# Patient Record
Sex: Female | Born: 1955 | Race: White | Hispanic: No | Smoking: Never smoker
Health system: Southern US, Community
[De-identification: ages and names within clinical notes are randomized; demographics above are authoritative.]

## PROBLEM LIST (undated history)

## (undated) DIAGNOSIS — E669 Obesity, unspecified: Secondary | ICD-10-CM

## (undated) DIAGNOSIS — I10 Essential (primary) hypertension: Secondary | ICD-10-CM

## (undated) DIAGNOSIS — E039 Hypothyroidism, unspecified: Secondary | ICD-10-CM

## (undated) DIAGNOSIS — R112 Nausea with vomiting, unspecified: Secondary | ICD-10-CM

## (undated) DIAGNOSIS — Z9889 Other specified postprocedural states: Secondary | ICD-10-CM

## (undated) DIAGNOSIS — G4730 Sleep apnea, unspecified: Secondary | ICD-10-CM

## (undated) DIAGNOSIS — L570 Actinic keratosis: Secondary | ICD-10-CM

## (undated) DIAGNOSIS — M1990 Unspecified osteoarthritis, unspecified site: Secondary | ICD-10-CM

## (undated) DIAGNOSIS — R0600 Dyspnea, unspecified: Secondary | ICD-10-CM

## (undated) DIAGNOSIS — Q600 Renal agenesis, unilateral: Secondary | ICD-10-CM

## (undated) HISTORY — PX: ABDOMINAL HYSTERECTOMY: SHX81

## (undated) HISTORY — DX: Actinic keratosis: L57.0

## (undated) HISTORY — PX: COLONOSCOPY W/ BIOPSIES AND POLYPECTOMY: SHX1376

---

## 2000-11-14 ENCOUNTER — Other Ambulatory Visit: Admission: RE | Admit: 2000-11-14 | Discharge: 2000-11-14 | Payer: Self-pay | Admitting: Family Medicine

## 2007-09-03 ENCOUNTER — Ambulatory Visit: Payer: Self-pay | Admitting: Gastroenterology

## 2011-04-17 ENCOUNTER — Ambulatory Visit: Payer: Self-pay | Admitting: Unknown Physician Specialty

## 2011-04-19 LAB — PATHOLOGY REPORT

## 2013-03-13 ENCOUNTER — Ambulatory Visit: Payer: Self-pay | Admitting: Family Medicine

## 2013-03-27 ENCOUNTER — Ambulatory Visit: Payer: Self-pay | Admitting: Family Medicine

## 2016-02-04 HISTORY — PX: OTHER SURGICAL HISTORY: SHX169

## 2016-02-09 ENCOUNTER — Encounter (HOSPITAL_COMMUNITY): Payer: Self-pay | Admitting: *Deleted

## 2016-02-09 NOTE — Progress Notes (Signed)
Pt denies SOB, chest pain, and being under the care of a cardiologist. Pt denies having a stress test, echo and cardiac cath. Pt denies having an EKG and chest x ray. Pt denies having any recent labs. Pt stated that last dose of Aspirin and Phentermine was Monday. Pt made aware to stop taking Aspirin, vitamins, fish oil, Phentermine and herbal medications. Do not take any NSAIDs ie: Ibuprofen, Advil, Naproxen, BC and Goody Powder or any medication containing Aspirin. Pt verbalized understanding of all pre-op instructions.

## 2016-02-10 ENCOUNTER — Encounter (HOSPITAL_COMMUNITY)
Admission: RE | Disposition: A | Discharge status: Home / Self Care | Payer: Self-pay | Source: Ambulatory Visit | Attending: Orthopedic Surgery

## 2016-02-10 ENCOUNTER — Encounter (HOSPITAL_COMMUNITY): Payer: Self-pay | Admitting: *Deleted

## 2016-02-10 ENCOUNTER — Ambulatory Visit (HOSPITAL_COMMUNITY): Payer: Worker's Compensation | Admitting: Anesthesiology

## 2016-02-10 ENCOUNTER — Ambulatory Visit (HOSPITAL_COMMUNITY)
Admission: RE | Admit: 2016-02-10 | Discharge: 2016-02-10 | Disposition: A | Discharge status: Home / Self Care | Payer: Worker's Compensation | Source: Ambulatory Visit | Attending: Orthopedic Surgery | Admitting: Orthopedic Surgery

## 2016-02-10 DIAGNOSIS — Z6841 Body Mass Index (BMI) 40.0 and over, adult: Secondary | ICD-10-CM | POA: Insufficient documentation

## 2016-02-10 DIAGNOSIS — M25341 Other instability, right hand: Secondary | ICD-10-CM | POA: Diagnosis not present

## 2016-02-10 DIAGNOSIS — X58XXXA Exposure to other specified factors, initial encounter: Secondary | ICD-10-CM | POA: Diagnosis not present

## 2016-02-10 DIAGNOSIS — G4730 Sleep apnea, unspecified: Secondary | ICD-10-CM | POA: Insufficient documentation

## 2016-02-10 DIAGNOSIS — E039 Hypothyroidism, unspecified: Secondary | ICD-10-CM | POA: Diagnosis not present

## 2016-02-10 DIAGNOSIS — S83421A Sprain of lateral collateral ligament of right knee, initial encounter: Secondary | ICD-10-CM | POA: Diagnosis present

## 2016-02-10 DIAGNOSIS — Z7982 Long term (current) use of aspirin: Secondary | ICD-10-CM | POA: Diagnosis not present

## 2016-02-10 DIAGNOSIS — I10 Essential (primary) hypertension: Secondary | ICD-10-CM | POA: Insufficient documentation

## 2016-02-10 DIAGNOSIS — S5331XA Traumatic rupture of right ulnar collateral ligament, initial encounter: Secondary | ICD-10-CM | POA: Diagnosis present

## 2016-02-10 HISTORY — DX: Obesity, unspecified: E66.9

## 2016-02-10 HISTORY — DX: Other specified postprocedural states: Z98.890

## 2016-02-10 HISTORY — PX: ULNAR COLLATERAL LIGAMENT REPAIR: SHX6159

## 2016-02-10 HISTORY — DX: Hypothyroidism, unspecified: E03.9

## 2016-02-10 HISTORY — DX: Essential (primary) hypertension: I10

## 2016-02-10 HISTORY — DX: Renal agenesis, unilateral: Q60.0

## 2016-02-10 HISTORY — DX: Nausea with vomiting, unspecified: R11.2

## 2016-02-10 HISTORY — DX: Sleep apnea, unspecified: G47.30

## 2016-02-10 LAB — BASIC METABOLIC PANEL
Anion gap: 9 (ref 5–15)
BUN: 13 mg/dL (ref 6–20)
CALCIUM: 9 mg/dL (ref 8.9–10.3)
CHLORIDE: 105 mmol/L (ref 101–111)
CO2: 24 mmol/L (ref 22–32)
CREATININE: 0.76 mg/dL (ref 0.44–1.00)
GFR calc Af Amer: >60 mL/min (ref >60)
GFR calc non Af Amer: >60 mL/min (ref >60)
GLUCOSE: 94 mg/dL (ref 65–99)
Potassium: 3.9 mmol/L (ref 3.5–5.1)
Sodium: 138 mmol/L (ref 135–145)

## 2016-02-10 LAB — CBC
HEMATOCRIT: 37.7 % (ref 36.0–46.0)
HEMOGLOBIN: 12.3 g/dL (ref 12.0–15.0)
MCH: 29 pg (ref 26.0–34.0)
MCHC: 32.6 g/dL (ref 30.0–36.0)
MCV: 88.9 fL (ref 78.0–100.0)
Platelets: 319 10*3/uL (ref 150–400)
RBC: 4.24 MIL/uL (ref 3.87–5.11)
RDW: 14.9 % (ref 11.5–15.5)
WBC: 11.1 10*3/uL — ABNORMAL HIGH (ref 4.0–10.5)

## 2016-02-10 SURGERY — REPAIR, LIGAMENT, ULNAR COLLATERAL
Anesthesia: Regional | Site: Hand | Laterality: Right

## 2016-02-10 MED ORDER — ROCURONIUM BROMIDE 100 MG/10ML IV SOLN
INTRAVENOUS | Status: DC | PRN
  Administered 2016-02-10: 30 mg via INTRAVENOUS

## 2016-02-10 MED ORDER — ONDANSETRON HCL 4 MG/2ML IJ SOLN: 4.0000 mg | Freq: Four times a day (QID) | INTRAMUSCULAR | Status: DC | PRN

## 2016-02-10 MED ORDER — PHENYLEPHRINE 40 MCG/ML (10ML) SYRINGE FOR IV PUSH (FOR BLOOD PRESSURE SUPPORT)
PREFILLED_SYRINGE | INTRAVENOUS | Status: AC
  Filled 2016-02-10: qty 10

## 2016-02-10 MED ORDER — FENTANYL CITRATE (PF) 100 MCG/2ML IJ SOLN
INTRAMUSCULAR | Status: DC | PRN
  Administered 2016-02-10: 100 ug via INTRAVENOUS

## 2016-02-10 MED ORDER — BUPIVACAINE HCL (PF) 0.25 % IJ SOLN
INTRAMUSCULAR | Status: DC | PRN
  Administered 2016-02-10: .09 mL

## 2016-02-10 MED ORDER — ALPRAZOLAM 1 MG PO TABS: 1.0000 mg | ORAL_TABLET | Freq: Three times a day (TID) | ORAL | 0 refills | Status: DC | PRN

## 2016-02-10 MED ORDER — BUPIVACAINE HCL (PF) 0.25 % IJ SOLN
INTRAMUSCULAR | Status: AC
  Filled 2016-02-10: qty 30

## 2016-02-10 MED ORDER — ARTIFICIAL TEARS OP OINT
TOPICAL_OINTMENT | OPHTHALMIC | Status: DC | PRN
  Administered 2016-02-10: 1 via OPHTHALMIC

## 2016-02-10 MED ORDER — SUCCINYLCHOLINE CHLORIDE 20 MG/ML IJ SOLN
INTRAMUSCULAR | Status: DC | PRN
  Administered 2016-02-10: 140 mg via INTRAVENOUS

## 2016-02-10 MED ORDER — CEFAZOLIN SODIUM-DEXTROSE 2-4 GM/100ML-% IV SOLN
2.0000 g | INTRAVENOUS | Status: AC
  Administered 2016-02-10: 2 g via INTRAVENOUS

## 2016-02-10 MED ORDER — OXYCODONE-ACETAMINOPHEN 5-325 MG PO TABS: 2.0000 | ORAL_TABLET | ORAL | 0 refills | Status: DC | PRN

## 2016-02-10 MED ORDER — LIDOCAINE 2% (20 MG/ML) 5 ML SYRINGE
INTRAMUSCULAR | Status: AC
  Filled 2016-02-10: qty 15

## 2016-02-10 MED ORDER — MIDAZOLAM HCL 5 MG/5ML IJ SOLN
INTRAMUSCULAR | Status: DC | PRN
  Administered 2016-02-10 (×2): 1 mg via INTRAVENOUS

## 2016-02-10 MED ORDER — LIDOCAINE HCL 4 % MT SOLN
OROMUCOSAL | Status: DC | PRN
  Administered 2016-02-10: 4 mL via TOPICAL

## 2016-02-10 MED ORDER — CEPHALEXIN 500 MG PO CAPS: 500.0000 mg | ORAL_CAPSULE | Freq: Four times a day (QID) | ORAL | 0 refills | Status: DC

## 2016-02-10 MED ORDER — MIDAZOLAM HCL 2 MG/2ML IJ SOLN
1.0000 mg | Freq: Once | INTRAMUSCULAR | Status: AC
  Administered 2016-02-10: 1 mg via INTRAVENOUS

## 2016-02-10 MED ORDER — CEFAZOLIN SODIUM-DEXTROSE 2-4 GM/100ML-% IV SOLN
INTRAVENOUS | Status: AC
  Filled 2016-02-10: qty 100

## 2016-02-10 MED ORDER — DEXAMETHASONE SODIUM PHOSPHATE 10 MG/ML IJ SOLN
INTRAMUSCULAR | Status: AC
  Filled 2016-02-10: qty 1

## 2016-02-10 MED ORDER — PROPOFOL 10 MG/ML IV BOLUS
INTRAVENOUS | Status: DC | PRN
  Administered 2016-02-10: 250 mg via INTRAVENOUS

## 2016-02-10 MED ORDER — HYDROMORPHONE HCL 1 MG/ML IJ SOLN: 0.2500 mg | INTRAMUSCULAR | Status: DC | PRN

## 2016-02-10 MED ORDER — SUGAMMADEX SODIUM 200 MG/2ML IV SOLN
INTRAVENOUS | Status: AC
  Filled 2016-02-10: qty 2

## 2016-02-10 MED ORDER — ONDANSETRON HCL 4 MG/2ML IJ SOLN
INTRAMUSCULAR | Status: DC | PRN
  Administered 2016-02-10: 4 mg via INTRAVENOUS

## 2016-02-10 MED ORDER — FENTANYL CITRATE (PF) 100 MCG/2ML IJ SOLN
INTRAMUSCULAR | Status: AC
  Filled 2016-02-10: qty 2

## 2016-02-10 MED ORDER — OXYCODONE HCL 5 MG/5ML PO SOLN: 5.0000 mg | Freq: Once | ORAL | Status: DC | PRN

## 2016-02-10 MED ORDER — BUPIVACAINE-EPINEPHRINE (PF) 0.5% -1:200000 IJ SOLN
INTRAMUSCULAR | Status: DC | PRN
  Administered 2016-02-10: 30 mL via PERINEURAL

## 2016-02-10 MED ORDER — SUGAMMADEX SODIUM 200 MG/2ML IV SOLN
INTRAVENOUS | Status: DC | PRN
  Administered 2016-02-10: 200 mg via INTRAVENOUS

## 2016-02-10 MED ORDER — ROCURONIUM BROMIDE 10 MG/ML (PF) SYRINGE
PREFILLED_SYRINGE | INTRAVENOUS | Status: AC
  Filled 2016-02-10: qty 10

## 2016-02-10 MED ORDER — ONDANSETRON HCL 4 MG/2ML IJ SOLN
INTRAMUSCULAR | Status: AC
  Filled 2016-02-10: qty 2

## 2016-02-10 MED ORDER — MIDAZOLAM HCL 2 MG/2ML IJ SOLN
INTRAMUSCULAR | Status: AC
  Filled 2016-02-10: qty 2

## 2016-02-10 MED ORDER — EPHEDRINE 5 MG/ML INJ
INTRAVENOUS | Status: AC
  Filled 2016-02-10: qty 10

## 2016-02-10 MED ORDER — 0.9 % SODIUM CHLORIDE (POUR BTL) OPTIME
TOPICAL | Status: DC | PRN
  Administered 2016-02-10: 1000 mL

## 2016-02-10 MED ORDER — LACTATED RINGERS IV SOLN
INTRAVENOUS | Status: DC
  Administered 2016-02-10: 50 mL/h via INTRAVENOUS

## 2016-02-10 MED ORDER — LACTATED RINGERS IV SOLN
INTRAVENOUS | Status: DC | PRN
Start: 2016-02-10 — End: 2016-02-10

## 2016-02-10 MED ORDER — OXYCODONE HCL 5 MG PO TABS: 5.0000 mg | ORAL_TABLET | Freq: Once | ORAL | Status: DC | PRN

## 2016-02-10 MED ORDER — MIDAZOLAM HCL 2 MG/2ML IJ SOLN
INTRAMUSCULAR | Status: AC
  Administered 2016-02-10: 1 mg via INTRAVENOUS
  Filled 2016-02-10: qty 2

## 2016-02-10 MED ORDER — LACTATED RINGERS IV SOLN
INTRAVENOUS | Status: DC | PRN
  Administered 2016-02-10: 14:00:00 via INTRAVENOUS

## 2016-02-10 MED ORDER — DEXAMETHASONE SODIUM PHOSPHATE 10 MG/ML IJ SOLN
INTRAMUSCULAR | Status: DC | PRN
  Administered 2016-02-10: 10 mg via INTRAVENOUS

## 2016-02-10 MED ORDER — FENTANYL CITRATE (PF) 100 MCG/2ML IJ SOLN
INTRAMUSCULAR | Status: AC
  Administered 2016-02-10: 50 ug via INTRAVENOUS
  Filled 2016-02-10: qty 2

## 2016-02-10 MED ORDER — FENTANYL CITRATE (PF) 100 MCG/2ML IJ SOLN
50.0000 ug | Freq: Once | INTRAMUSCULAR | Status: AC
  Administered 2016-02-10: 50 ug via INTRAVENOUS

## 2016-02-10 MED ORDER — CHLORHEXIDINE GLUCONATE 4 % EX LIQD: 60.0000 mL | Freq: Once | CUTANEOUS | Status: DC

## 2016-02-10 SURGICAL SUPPLY — 46 items
ANCH SUT 2-0 3/8 CRC MIC FT 4 (Anchor) ×1 IMPLANT
ANCHOR FT CORKSCREW MICRO 2-0 (Anchor) ×1 IMPLANT
BANDAGE ACE 4X5 VEL STRL LF (GAUZE/BANDAGES/DRESSINGS) ×1 IMPLANT
BANDAGE ELASTIC 3 VELCRO ST LF (GAUZE/BANDAGES/DRESSINGS) ×4 IMPLANT
BNDG GAUZE ELAST 4 BULKY (GAUZE/BANDAGES/DRESSINGS) ×4 IMPLANT
BRUSH SCRUB EZ PLAIN DRY (MISCELLANEOUS) ×1 IMPLANT
CORDS BIPOLAR (ELECTRODE) ×2 IMPLANT
COVER SURGICAL LIGHT HANDLE (MISCELLANEOUS) ×2 IMPLANT
CUFF TOURNIQUET SINGLE 24IN (TOURNIQUET CUFF) ×1 IMPLANT
DECANTER SPIKE VIAL GLASS SM (MISCELLANEOUS) ×2 IMPLANT
DRAPE OEC MINIVIEW 54X84 (DRAPES) ×1 IMPLANT
DRAPE SURG 17X23 STRL (DRAPES) ×2 IMPLANT
DRSG EMULSION OIL 3X3 NADH (GAUZE/BANDAGES/DRESSINGS) ×1 IMPLANT
GAUZE SPONGE 4X4 12PLY STRL (GAUZE/BANDAGES/DRESSINGS) ×1 IMPLANT
GAUZE XEROFORM 1X8 LF (GAUZE/BANDAGES/DRESSINGS) ×3 IMPLANT
GLOVE BIOGEL M 8.0 STRL (GLOVE) ×2 IMPLANT
GLOVE INDICATOR 7.0 STRL GRN (GLOVE) ×2 IMPLANT
GLOVE SS BIOGEL STRL SZ 8 (GLOVE) ×1 IMPLANT
GLOVE SUPERSENSE BIOGEL SZ 8 (GLOVE) ×1
GOWN STRL REUS W/ TWL LRG LVL3 (GOWN DISPOSABLE) ×2 IMPLANT
GOWN STRL REUS W/ TWL XL LVL3 (GOWN DISPOSABLE) ×3 IMPLANT
GOWN STRL REUS W/TWL LRG LVL3 (GOWN DISPOSABLE) ×4
GOWN STRL REUS W/TWL XL LVL3 (GOWN DISPOSABLE) ×6
K-WIRE DBL TROCAR .035X4 ×2 IMPLANT
KIT BASIN OR (CUSTOM PROCEDURE TRAY) ×2 IMPLANT
KIT ROOM TURNOVER OR (KITS) ×2 IMPLANT
KWIRE DBL TROCAR .035X4 ×1 IMPLANT
MANIFOLD NEPTUNE II (INSTRUMENTS) ×2 IMPLANT
NDL HYPO 25GX1X1/2 BEV (NEEDLE) IMPLANT
NEEDLE HYPO 25GX1X1/2 BEV (NEEDLE) ×2 IMPLANT
NS IRRIG 1000ML POUR BTL (IV SOLUTION) ×2 IMPLANT
PACK ORTHO EXTREMITY (CUSTOM PROCEDURE TRAY) ×2 IMPLANT
PAD ARMBOARD 7.5X6 YLW CONV (MISCELLANEOUS) ×4 IMPLANT
PAD CAST 4YDX4 CTTN HI CHSV (CAST SUPPLIES) ×2 IMPLANT
PADDING CAST COTTON 4X4 STRL (CAST SUPPLIES) ×4
SLING ARM FOAM STRAP LRG (SOFTGOODS) ×1 IMPLANT
SOLUTION BETADINE 4OZ (MISCELLANEOUS) ×2 IMPLANT
SPLINT FIBERGLASS 3X35 (CAST SUPPLIES) ×1 IMPLANT
SPONGE SCRUB IODOPHOR (GAUZE/BANDAGES/DRESSINGS) ×2 IMPLANT
SUT PROLENE 4 0 PS 2 18 (SUTURE) ×1 IMPLANT
SUT VIC AB 2-0 CT1 27 (SUTURE)
SUT VIC AB 2-0 CT1 TAPERPNT 27 (SUTURE) IMPLANT
SYR CONTROL 10ML LL (SYRINGE) ×1 IMPLANT
TOWEL OR 17X26 10 PK STRL BLUE (TOWEL DISPOSABLE) ×2 IMPLANT
TUBE CONNECTING 12X1/4 (SUCTIONS) ×2 IMPLANT
UNDERPAD 30X30 (UNDERPADS AND DIAPERS) ×2 IMPLANT

## 2016-02-10 NOTE — Anesthesia Procedure Notes (Signed)
Anesthesia Regional Block:  Supraclavicular block  Pre-Anesthetic Checklist: ,, timeout performed, Correct Patient, Correct Site, Correct Laterality, Correct Procedure, Correct Position, site marked, Risks and benefits discussed,  Surgical consent,  Pre-op evaluation,  At surgeon's request and post-op pain management  Laterality: Right  Prep: chloraprep       Needles:  Injection technique: Single-shot  Needle Type: Echogenic Stimulator Needle     Needle Length: 5cm 5 cm Needle Gauge: 22 and 22 G    Additional Needles:  Procedures: ultrasound guided (picture in chart) and nerve stimulator Supraclavicular block  Nerve Stimulator or Paresthesia:  Response: biceps flexion, 0.45 mA,   Additional Responses:   Narrative:  Start time: 02/10/2016 2:40 PM End time: 02/10/2016 2:50 PM Injection made incrementally with aspirations every 5 mL.  Performed by: Personally  Anesthesiologist: Timonthy Hovater  Additional Notes: Functioning IV was confirmed and monitors were applied.  A 31mm 22ga Arrow echogenic stimulator needle was used. Sterile prep and drape,hand hygiene and sterile gloves were used.  Negative aspiration and negative test dose prior to incremental administration of local anesthetic. The patient tolerated the procedure well.  Ultrasound guidance: relevent anatomy identified, needle position confirmed, local anesthetic spread visualized around nerve(s), vascular puncture avoided.  Image printed for medical record.

## 2016-02-10 NOTE — Discharge Instructions (Signed)
General Anesthesia, Adult, Care After These instructions provide you with information about caring for yourself after your procedure. Your health care provider may also give you more specific instructions. Your treatment has been planned according to current medical practices, but problems sometimes occur. Call your health care provider if you have any problems or questions after your procedure. What can I expect after the procedure? After the procedure, it is common to have:  Vomiting.  A sore throat.  Mental slowness. It is common to feel:  Nauseous.  Cold or shivery.  Sleepy.  Tired.  Sore or achy, even in parts of your body where you did not have surgery. Follow these instructions at home: For at least 24 hours after the procedure:  Do not:  Participate in activities where you could fall or become injured.  Drive.  Use heavy machinery.  Drink alcohol.  Take sleeping pills or medicines that cause drowsiness.  Make important decisions or sign legal documents.  Take care of children on your own.  Rest. Eating and drinking  If you vomit, drink water, juice, or soup when you can drink without vomiting.  Drink enough fluid to keep your urine clear or pale yellow.  Make sure you have little or no nausea before eating solid foods.  Follow the diet recommended by your health care provider. General instructions  Have a responsible adult stay with you until you are awake and alert.  Return to your normal activities as told by your health care provider. Ask your health care provider what activities are safe for you.  Take over-the-counter and prescription medicines only as told by your health care provider.  If you smoke, do not smoke without supervision.  Keep all follow-up visits as told by your health care provider. This is important. Contact a health care provider if:  You continue to have nausea or vomiting at home, and medicines are not helpful.  You  cannot drink fluids or start eating again.  You cannot urinate after 8-12 hours.  You develop a skin rash.  You have fever.  You have increasing redness at the site of your procedure. Get help right away if:  You have difficulty breathing.  You have chest pain.  You have unexpected bleeding.  You feel that you are having a life-threatening or urgent problem. This information is not intended to replace advice given to you by your health care provider. Make sure you discuss any questions you have with your health care provider. Document Released: 05/29/2000 Document Revised: 07/26/2015 Document Reviewed: 02/04/2015 Elsevier Interactive Patient Education  2017 [place].  Peripheral Nerve Blocks Your caregiver has placed a nerve block in one of your arms or legs to reduce pain and discomfort. The block lessens the amount of pain medicine you will need. Your caregiver will inject you in the arm or leg that was operated on. The injection is usually given away from the surgical site. The injection is a local anesthetic or a combination of local anesthetics. This injection provides numbing pain relief for up to 18 to 24 hours. There are few possible complications from this procedure. However, you should notify your caregiver if you have any problems. Be aware that you may lose feeling at and around the surgical area. If numbness happens, take proper measures to avoid injury. Do not stand up unassisted if you have a nerve block in your leg. Do not try to lift items if you have had a nerve block in your arm. Be careful  when placing hot or cold items on a numb extremity. SEEK IMMEDIATE MEDICAL CARE IF:  You have redness, swelling, pain, or discharge at the injection site.  You develop dizziness or lightheadedness.  You have blurred vision.  There is a ringing or buzzing in your ears.  You have a metal taste in your mouth.  You develop numbness or tingling around your mouth.  You  develop drowsiness.  You develop confusion. This information is not intended to replace advice given to you by your health care provider. Make sure you discuss any questions you have with your health care provider. Document Released: 05/30/2007 Document Revised: 05/15/2011 Document Reviewed: 10/27/2014 Elsevier Interactive Patient Education  2017 [place].   Please be sure to use your C- Pap when sleeping     Keep bandage clean and dry.  Call for any problems.  No smoking.  Criteria for driving a car: you should be off your pain medicine for 7-8 hours, able to drive one handed(confident), thinking clearly and feeling able in your judgement to drive. Continue elevation as it will decrease swelling.  If instructed by MD move your fingers within the confines of the bandage/splint.  Use ice if instructed by your MD. Call immediately for any sudden loss of feeling in your hand/arm or change in functional abilities of the extremity.We recommend that you to take vitamin C 1000 mg a day to promote healing. We also recommend that if you require  pain medicine that you take a stool softener to prevent constipation as most pain medicines will have constipation side effects. We recommend either Peri-Colace or Senokot and recommend that you also consider adding MiraLAX as well to prevent the constipation affects from pain medicine if you are required to use them. These medicines are over the counter and may be purchased at a local pharmacy. A cup of yogurt and a probiotic can also be helpful during the recovery process as the medicines can disrupt your intestinal environment.

## 2016-02-10 NOTE — Op Note (Signed)
Please see full dictation  Status post ulnar collateral ligament repair, external fixation, 4 view radiographic series of the hand  We'll plan for pin removal first postop visit.  [name] discharged home  Keflex 7 days 500 mg 1 by mouth 4 times a day, OxyIR when necessary pain, Xanax when necessary anxiety  Brandi Henry M.D.

## 2016-02-10 NOTE — Anesthesia Postprocedure Evaluation (Signed)
Anesthesia Post Note  Patient: Brandi Henry  Procedure(s) Performed: Procedure(s) (LRB): Rt thumb Ulnar collateral ligament reconstruction as necessary with pinning metacarpal phalangeal joint ULNAR COLLATERAL LIGAMENT REPAIR (Right)  Patient location during evaluation: PACU Anesthesia Type: General and Regional Level of consciousness: awake and alert Pain management: pain level controlled Vital Signs Assessment: post-procedure vital signs reviewed and stable Respiratory status: spontaneous breathing, nonlabored ventilation, respiratory function stable and patient connected to nasal cannula oxygen Cardiovascular status: blood pressure returned to baseline and stable Postop Assessment: no signs of nausea or vomiting Anesthetic complications: no    Last Vitals:  Vitals:   02/10/16 1915 02/10/16 1930  BP:    Pulse: 79 78  Resp: (!) 25 (!) 22  Temp:      Last Pain:  Vitals:   02/10/16 1845  TempSrc:   PainSc: 0-No pain                 Gianlucca Szymborski,W. EDMOND

## 2016-02-10 NOTE — H&P (Signed)
Brandi Henry is an 60 y.o. female.   Chief Complaint: Right thumb collateral ligament tear presents for surgery  HPI: Patient presents for ulnar collateral ligament repair right thumb MCP joint  Past Medical History:  Diagnosis Date  . 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 Hypertension   . Hypothyroidism   . Obesity   . PONV (postoperative nausea and vomiting)   . Sleep apnea    wears CPAP    Past Surgical History:  Procedure Laterality Date  . ABDOMINAL HYSTERECTOMY    . CESAREAN SECTION    . COLONOSCOPY W/ BIOPSIES AND POLYPECTOMY      Family History  Problem Relation Age of Onset  . Lung cancer Mother   . Diabetes Mother   . Hypertension Mother   . Colon cancer Father   . Diabetes Father    Social History:  reports that she has never smoked. She has never used smokeless tobacco. She reports that she drinks alcohol. She reports that she does not use drugs.  Allergies:  Allergies  Allergen Reactions  . Sulfur Hives    Thirty years ago    Medications Prior to Admission  Medication Sig Dispense Refill  . amLODipine (NORVASC) 10 MG tablet Take 10 mg by mouth at bedtime.    Marland Kitchen aspirin EC 81 MG tablet Take 81 mg by mouth daily.    Marland Kitchen levothyroxine (SYNTHROID, LEVOTHROID) 112 MCG tablet Take 112 mcg by mouth daily before breakfast.    . Liniments (DEEP BLUE RELIEF EX) Apply 1 application topically at bedtime.    Marland Kitchen lisinopril-hydrochlorothiazide (PRINZIDE,ZESTORETIC) 20-12.5 MG tablet Take 2 tablets by mouth daily.    Marland Kitchen OVER THE COUNTER MEDICATION Take 1 tablet by mouth daily. Doterra essential oil vitamin regimen    . phentermine (ADIPEX-P) 37.5 MG tablet Take 37.5 mg by mouth daily.  0    Results for orders placed or performed during the hospital encounter of 02/10/16 (from the past 48 hour(s))  CBC     Status: Abnormal   Collection Time: 02/10/16  1:39 PM  Result Value Ref Range   WBC 11.1 (H) 4.0 - 10.5 K/uL   RBC 4.24 3.87 - 5.11 MIL/uL   Hemoglobin 12.3 12.0 - 15.0 g/dL   HCT 37.7 36.0 - 46.0 %   MCV 88.9 78.0 - 100.0 fL   MCH 29.0 26.0 - 34.0 pg   MCHC 32.6 30.0 - 36.0 g/dL   RDW 14.9 11.5 - 15.5 %   Platelets 319 150 - 400 K/uL  Basic metabolic panel     Status: None   Collection Time: 02/10/16  1:39 PM  Result Value Ref Range   Sodium 138 135 - 145 mmol/L   Potassium 3.9 3.5 - 5.1 mmol/L   Chloride 105 101 - 111 mmol/L   CO2 24 22 - 32 mmol/L   Glucose, Bld 94 65 - 99 mg/dL   BUN 13 6 - 20 mg/dL   Creatinine, Ser 0.76 0.44 - 1.00 mg/dL   Calcium 9.0 8.9 - 10.3 mg/dL   GFR calc non Af Amer >60 >60 mL/min   GFR calc Af Amer >60 >60 mL/min    Comment: (NOTE) The eGFR has been calculated using the CKD EPI equation. This calculation has not been validated in all clinical situations. eGFR's persistently <60 mL/min signify possible Chronic Kidney Disease.    Anion gap 9 5 - 15   No results found.  ROS  Blood pressure Marland Kitchen)  150/71, pulse 76, temperature 98.3 F (36.8 C), temperature source Oral, resp. rate 17, height '5\' 2"'$  (1.575 m), weight (!) 142.9 kg (315 lb), SpO2 100 %. Physical Exam  Complete ulnar collateral ligament tear right thumb  The patient is alert and oriented in no acute distress. The patient complains of pain in the affected upper extremity.  The patient is noted to have a normal HEENT exam. Lung fields show equal chest expansion and no shortness of breath. Abdomen exam is nontender without distention. Lower extremity examination does not show any fracture dislocation or blood clot symptoms. Pelvis is stable and the neck and back are stable and nontender. Assessment/Plan Plan for ulnar collateral ligament repair right thumb MCP joint  We are planning surgery for your upper extremity. The risk and benefits of surgery to include risk of bleeding, infection, anesthesia,  damage to normal structures and failure of the surgery to accomplish its intended goals of relieving symptoms and restoring  function have been discussed in detail. With this in mind we plan to proceed. I have specifically discussed with the patient the pre-and postoperative regime and the dos and don'ts and risk and benefits in great detail. Risk and benefits of surgery also include risk of dystrophy(CRPS), chronic nerve pain, failure of the healing process to go onto completion and other inherent risks of surgery The relavent the pathophysiology of the disease/injury process, as well as the alternatives for treatment and postoperative course of action has been discussed in great detail with the patient who desires to proceed.  We will do everything in our power to help you (the patient) restore function to the upper extremity. It is a pleasure to see this patient today.   Paulene Floor, MD 02/10/2016, 4:07 PM

## 2016-02-10 NOTE — Anesthesia Procedure Notes (Signed)
Procedure Name: Intubation Date/Time: 02/10/2016 4:21 PM Performed by: Jacquiline Doe A Pre-anesthesia Checklist: Patient identified, Emergency Drugs available, Suction available and Patient being monitored Patient Re-evaluated:Patient Re-evaluated prior to inductionOxygen Delivery Method: Circle System Utilized and Circle system utilized Preoxygenation: Pre-oxygenation with 100% oxygen Intubation Type: IV induction and Cricoid Pressure applied Ventilation: Mask ventilation without difficulty Grade View: Grade II Tube type: Oral Tube size: 7.5 mm Number of attempts: 1 Airway Equipment and Method: Stylet and Oral airway Placement Confirmation: ETT inserted through vocal cords under direct vision,  positive ETCO2 and breath sounds checked- equal and bilateral Secured at: 22 cm Tube secured with: Tape Dental Injury: Teeth and Oropharynx as per pre-operative assessment

## 2016-02-10 NOTE — Anesthesia Preprocedure Evaluation (Addendum)
Anesthesia Evaluation  Patient identified by MRN, date of birth, ID band Patient awake    Reviewed: Allergy & Precautions, H&P , NPO status , Patient's Chart, lab work & pertinent test results  History of Anesthesia Complications (+) PONV  Airway Mallampati: II   Neck ROM: full    Dental   Pulmonary sleep apnea ,    breath sounds clear to auscultation       Cardiovascular hypertension,  Rhythm:regular Rate:Normal     Neuro/Psych    GI/Hepatic   Endo/Other  Hypothyroidism Morbid obesity  Renal/GU No left kidney     Musculoskeletal   Abdominal   Peds  Hematology   Anesthesia Other Findings   Reproductive/Obstetrics                             Anesthesia Physical Anesthesia Plan  ASA: III  Anesthesia Plan: General and Regional   Post-op Pain Management:  Regional for Post-op pain   Induction: Intravenous  Airway Management Planned: LMA  Additional Equipment:   Intra-op Plan:   Post-operative Plan:   Informed Consent: I have reviewed the patients History and Physical, chart, labs and discussed the procedure including the risks, benefits and alternatives for the proposed anesthesia with the patient or authorized representative who has indicated his/her understanding and acceptance.     Plan Discussed with: CRNA, Anesthesiologist and Surgeon  Anesthesia Plan Comments:        Anesthesia Quick Evaluation

## 2016-02-10 NOTE — Transfer of Care (Signed)
Immediate Anesthesia Transfer of Care Note  Patient: Brandi Henry  Procedure(s) Performed: Procedure(s): Rt thumb Ulnar collateral ligament reconstruction as necessary with pinning metacarpal phalangeal joint ULNAR COLLATERAL LIGAMENT REPAIR (Right)  Patient Location: PACU  Anesthesia Type:GA combined with regional for post-op pain  Level of Consciousness: awake, oriented, sedated, patient cooperative and responds to stimulation  Airway & Oxygen Therapy: Patient Spontanous Breathing and Patient connected to nasal cannula oxygen  Post-op Assessment: Report given to RN, Post -op Vital signs reviewed and stable, Patient moving all extremities and Patient moving all extremities X 4  Post vital signs: Reviewed and stable  Last Vitals:  Vitals:   02/10/16 1315 02/10/16 1450  BP: (!) 159/72 (!) 150/71  Pulse: 83 76  Resp: 18 17  Temp: 36.8 C     Last Pain:  Vitals:   02/10/16 1450  TempSrc:   PainSc: 0-No pain      Patients Stated Pain Goal: 3 ([id number])  Complications: No apparent anesthesia complications

## 2016-02-11 NOTE — Op Note (Signed)
NAME:  Brandi Henry, Brandi Henry                      ACCOUNT NO.:  MEDICAL RECORD NO.:  [id number]  LOCATION:                                 FACILITY:  PHYSICIAN:  Satira Anis. Luay Balding, M.D.     DATE OF BIRTH:  DATE OF PROCEDURE:02/10/2016 DATE OF DISCHARGE:02/10/2016                              OPERATIVE REPORT   PREOPERATIVE DIAGNOSIS:  Ulnar collateral ligament tear, right thumb displaced with instability.  POSTOPERATIVE DIAGNOSIS:  Ulnar collateral ligament tear, right thumb displaced with instability.  PROCEDURE: 1. Repair of ulnar collateral ligament, right thumb directly at the     metacarpophalangeal joint utilizing an Arthrex 2.2 mm micro     corkscrew anchor. 2. External fixation, metacarpophalangeal joint, right thumb. 3. Stress radiography/radiographic examination, 4B series, right hand.  SURGEON:  Satira Anis. Amedeo Plenty, M.D.  ASSISTANT:  None.  COMPLICATIONS:  None.  ANESTHESIA:  General and preoperative block.  TOURNIQUET TIME:  Less than 30 minutes.  INDICATIONS:  A 60 year old female who presents with the above-mentioned diagnosis.  I have counseled her in regard to all risks and benefits of surgery and she desires to proceed.  OPERATIVE PROCEDURE:  The patient was seen by myself and Anesthesia. Taken to the operative theater and underwent a smooth induction of general anesthetic.  She was prepped and draped in usual sterile fashion with Hibiclens, prescrubbed by myself, followed by 10 minutes surgical Betadine scrub and paint by operative staff.  Following this, outline marks were made, tourniquet was insufflated, time-out observed, and an ulnar incision overlying the MCP joint was observed.  I very carefully dissected down and with facial nerve dissector swept superficial radial nerve out of harm's way.  Under 4.5 loupe magnification, I identified the tear and the adductor.  The adductor was very carefully released proximally, but not taken down to its fullest extent.   This was a tear off the MCP joint, which is rather unusual, but nevertheless provided excellent footprint for repair.  At this time, I debrided the footprint, confirmed the instability, and placed a 2.2 Arthrex corkscrew anchor.  I then reduced the joint, performed external fixation with 0.045 K-wire. This was bent, checked under stress radiography, and the patient was noted to have excellent reduction of the joint.  AP, lateral, and oblique x-rays confirmed reduction and the pin was then clipped for later removal at first postop visit.  Following this, the patient then underwent a modified pants-over-vest repair of the proper collateral ligament and portions of the accessory collateral ligament of the ulnar collateral ligament at the MCP joint.  The patient tolerated this well. There were no complicating features.  Following this, I then repaired the adductor, irrigated, deflated the tourniquet, and closed the wound with Prolene.  The patient tolerated this well.  She was placed in a thumb spica splint, and she will be discharged home.  I did discuss her discharge with Anesthesia, they felt comfortable discharging her as long as she had her CPAP available.  Do's and don'ts have been discussed.  She received preoperative Ancef and should do well into the future in my estimation.  I will discharge her on Keflex x7 days.  Satira Anis. Amedeo Plenty, M.D.     [facility]  D:  02/10/2016  T:  02/11/2016  Job:  [id number]

## 2016-02-14 ENCOUNTER — Encounter (HOSPITAL_COMMUNITY): Payer: Self-pay | Admitting: Orthopedic Surgery

## 2016-05-03 ENCOUNTER — Telehealth: Payer: Self-pay

## 2016-05-03 NOTE — Telephone Encounter (Signed)
AMS I called [facility] yesterday and gave them new information regarding the Phentermine vs. Belviq. Prior Authorization denied again. Please advise KJ CMA

## 2016-05-03 NOTE — Telephone Encounter (Signed)
Pt states Brandi Henry called yesterday about rx refill.  Please call back.

## 2016-05-03 NOTE — Telephone Encounter (Signed)
Message left for patient to call me back regarding medication authorization

## 2016-05-04 NOTE — Telephone Encounter (Signed)
Please have patient come in on Monday per AMS for medication follow up.

## 2016-05-04 NOTE — Telephone Encounter (Signed)
Have her come by the office sometime on Monday and I'll teach her how to do the injections with the samples

## 2016-05-04 NOTE — Telephone Encounter (Signed)
Pt aware of Belviq not having authorization. She would like to try Saxenda. Pt aware AMS is not in office today but when he returns we will get that called in to her pharmacy. KJ CMA

## 2016-05-04 NOTE — Telephone Encounter (Signed)
Pt trying to connect c Kim. Please call.

## 2016-05-09 ENCOUNTER — Ambulatory Visit (INDEPENDENT_AMBULATORY_CARE_PROVIDER_SITE_OTHER): Payer: BC Managed Care – PPO | Admitting: Obstetrics and Gynecology

## 2016-05-09 ENCOUNTER — Encounter: Payer: Self-pay | Admitting: Obstetrics and Gynecology

## 2016-05-09 VITALS — BP 138/76 | HR 102 | Ht 62.0 in | Wt 315.0 lb

## 2016-05-09 DIAGNOSIS — Z6841 Body Mass Index (BMI) 40.0 and over, adult: Secondary | ICD-10-CM

## 2016-05-09 MED ORDER — LIRAGLUTIDE -WEIGHT MANAGEMENT 18 MG/3ML ~~LOC~~ SOPN: 3.0000 mg | PEN_INJECTOR | Freq: Every day | SUBCUTANEOUS | 2 refills | Status: DC

## 2016-05-09 MED ORDER — INSULIN PEN NEEDLE 32G X 6 MM MISC: 1.0000 [IU] | Freq: Every day | 3 refills | Status: DC

## 2016-05-09 NOTE — Progress Notes (Signed)
Gynecology Office Visit  Chief Complaint:  Chief Complaint  Patient presents with  . medication follow up    History of Present Illness: Patientis a 61 y.o. G1P0 female, who presents for the evaluation of weight gain. She has is interested in weight loss. The patient states the following issues have contributed to her weight problem: physical inactivity, time for exercise, stress.  The patient has no additional symptoms. The patient specifically denies memory loss, muscle weakness, excessive thirst, and polyuria. Weight related co-morbidities include hypertension sleep apnea. The patient's past medical history is notable for hypertension. She has tried dieting, exercise, phentermine, belviq interventions in the past with limted success.   Review of Systems: 10 point review of systems negative unless otherwise noted in HPI  Past Medical History:  Past Medical History:  Diagnosis Date  . 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 Hypertension   . Hypothyroidism   . Obesity   . PONV (postoperative nausea and vomiting)   . Sleep apnea    wears CPAP    Past Surgical History:  Past Surgical History:  Procedure Laterality Date  . ABDOMINAL HYSTERECTOMY    . CESAREAN SECTION    . COLONOSCOPY W/ BIOPSIES AND POLYPECTOMY    . ULNAR COLLATERAL LIGAMENT REPAIR Right 02/10/2016   Procedure: Rt thumb Ulnar collateral ligament reconstruction as necessary with pinning metacarpal phalangeal joint ULNAR COLLATERAL LIGAMENT REPAIR;  Surgeon: Roseanne Kaufman, MD;  Location: [facility];  Service: Orthopedics;  Laterality: Right;    Gynecologic History: No LMP recorded. Patient has had a hysterectomy.  Obstetric History: G1P0  Family History:  Family History  Problem Relation Age of Onset  . Lung cancer Mother   . Diabetes Mother   . Hypertension Mother   . Colon cancer Father   . Diabetes Father     Social History:  Social History   Social History  . Marital status:  Divorced    Spouse name: N/A  . Number of children: N/A  . Years of education: N/A   Occupational History  . Not on file.   Social History Main Topics  . Smoking status: Never Smoker  . Smokeless tobacco: Never Used  . Alcohol use Yes     Comment: rare  . Drug use: No  . Sexual activity: Not on file   Other Topics Concern  . Not on file   Social History Narrative  . No narrative on file    Allergies:  Allergies  Allergen Reactions  . Sulfur Hives    Thirty years ago    Medications: Prior to Admission medications   Medication Sig Start Date End Date Taking? Authorizing Provider  ALPRAZolam Duanne Moron) 1 MG tablet Take 1 tablet (1 mg total) by mouth 3 (three) times daily as needed for anxiety. 02/10/16   Roseanne Kaufman, MD  amLODipine (NORVASC) 10 MG tablet Take 10 mg by mouth at bedtime. 01/10/16   Historical Provider, MD  aspirin EC 81 MG tablet Take 81 mg by mouth daily.    Historical Provider, MD  cephALEXin (KEFLEX) 500 MG capsule Take 1 capsule (500 mg total) by mouth 4 (four) times daily. 02/10/16   Roseanne Kaufman, MD  Insulin Pen Needle (NOVOFINE) 32G X 6 MM MISC 1 Units by Does not apply route daily. 05/09/16   Malachy Mood, MD  levothyroxine (SYNTHROID, LEVOTHROID) 112 MCG tablet Take 112 mcg by mouth daily before breakfast. 01/22/16   Historical Provider, MD  Liniments (  DEEP BLUE RELIEF EX) Apply 1 application topically at bedtime.    Historical Provider, MD  Liraglutide -Weight Management (SAXENDA) 18 MG/3ML SOPN Inject 3 mg into the skin daily. 05/09/16   Malachy Mood, MD  lisinopril-hydrochlorothiazide (PRINZIDE,ZESTORETIC) 20-12.5 MG tablet Take 2 tablets by mouth daily. 12/13/15   Historical Provider, MD  OVER THE COUNTER MEDICATION Take 1 tablet by mouth daily. Doterra essential oil vitamin regimen    Historical Provider, MD  oxyCODONE-acetaminophen (ROXICET) 5-325 MG tablet Take 2 tablets by mouth every 4 (four) hours as needed for severe pain. 02/10/16   Roseanne Kaufman, MD  phentermine (ADIPEX-P) 37.5 MG tablet Take 37.5 mg by mouth daily. 01/05/16   Historical Provider, MD    Physical Exam Vitals:  Vitals:   05/09/16 1126  BP: 138/76  Pulse: (!) 102   No LMP recorded. Patient has had a hysterectomy.  General: NAD HEENT: normocephalic, anicteric Thyroid: no enlargement Pulmonary: no increased work of breathing Neurologic: Grossly intact Psychiatric: mood appropriate, affect full  Assessment: 61 y.o. G1P0 No problem-specific Assessment & Plan notes found for this encounter.   Plan: Problem List Items Addressed This Visit      Other   Morbid obesity with BMI of 50.0-59.9, adult ([place]) - Primary   Relevant Medications   Liraglutide -Weight Management (SAXENDA) 18 MG/3ML SOPN    Other Visit Diagnoses    Adult BMI 50.0-59.9 kg/sq m ([place])       Relevant Medications   Liraglutide -Weight Management (SAXENDA) 18 MG/3ML SOPN      1) 1800 Calorie ADA Diet given handout on AVS  2) Patient education given regarding appropriate lifestyle changes for weight loss including: regular physical activity, healthy coping strategies, caloric restriction and healthy eating patterns.  3) Patient will be started on weight loss medication. The risks and benefits and side effects of medication, such as Adipex (Phenteramine) ,  Tenuate (Diethylproprion), Belviq (lorcarsin), Contrave (buproprion/naltrexone), Qsymia (phentermine/topiramate), and Saxenda (liraglutide) is discussed. The pros and cons of suppressing appetite and boosting metabolism is discussed. Risks of tolerence and addiction is discussed for selected agents discussed. Use of medicine will ne short term, such as 3-4 months at a time followed by a period of time off of the medicine to avoid these risks and side effects for Adipex, Qsymia, and Tenuate discussed. Pt to call with any negative side effects and agrees to keep follow up appts.  4) Comorbidity Screening - hypothyroidism screening,  diabetes, and hyperlipidemia screening previously completed  5) Encouraged weekly weight monitorig to track progress and sample 1 week food diary  6) The patient has tried and failed phentermine, and belviq XR.  She has contra-indications for contrave and qsymia.  7) 15 minutes face-to-face; counseling/coordination of care > 50 percent of visit  8) Follow up in 3 months to assess response

## 2016-05-09 NOTE — Patient Instructions (Signed)

## 2016-08-09 ENCOUNTER — Ambulatory Visit (INDEPENDENT_AMBULATORY_CARE_PROVIDER_SITE_OTHER): Payer: BC Managed Care – PPO | Admitting: Obstetrics and Gynecology

## 2016-08-09 ENCOUNTER — Encounter: Payer: Self-pay | Admitting: Obstetrics and Gynecology

## 2016-08-09 VITALS — BP 122/72 | HR 83 | Ht 62.0 in | Wt 297.0 lb

## 2016-08-09 DIAGNOSIS — Z6841 Body Mass Index (BMI) 40.0 and over, adult: Secondary | ICD-10-CM

## 2016-08-09 DIAGNOSIS — E669 Obesity, unspecified: Secondary | ICD-10-CM

## 2016-08-09 DIAGNOSIS — IMO0001 Reserved for inherently not codable concepts without codable children: Secondary | ICD-10-CM

## 2016-08-09 MED ORDER — LIRAGLUTIDE -WEIGHT MANAGEMENT 18 MG/3ML ~~LOC~~ SOPN: 3.0000 mg | PEN_INJECTOR | Freq: Every day | SUBCUTANEOUS | 2 refills | Status: DC

## 2016-08-09 MED ORDER — INSULIN PEN NEEDLE 32G X 6 MM MISC: 1.0000 [IU] | Freq: Every day | 3 refills | Status: DC

## 2016-08-09 NOTE — Progress Notes (Signed)
Gynecology Office Visit  Chief Complaint:  Chief Complaint  Patient presents with  . Weight Check    History of Present Illness: Patientis a 61 y.o. G1P0 female, who presents for the evaluation of the desire to lose weight. She has lost 18 pounds. The patient states the following symptoms since starting her weight loss therapy: appetite suppression, energy, and weight loss.  The patient also reports no other ill effects. The patient specifically denies heart palpitations, anxiety, and insomnia.    Review of Systems: 10 point review of systems negative unless otherwise noted in HPI  Past Medical History:  Past Medical History:  Diagnosis Date  . 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 Hypertension   . Hypothyroidism   . Obesity   . PONV (postoperative nausea and vomiting)   . Sleep apnea    wears CPAP    Past Surgical History:  Past Surgical History:  Procedure Laterality Date  . ABDOMINAL HYSTERECTOMY    . CESAREAN SECTION    . COLONOSCOPY W/ BIOPSIES AND POLYPECTOMY    . ULNAR COLLATERAL LIGAMENT REPAIR Right 02/10/2016   Procedure: Rt thumb Ulnar collateral ligament reconstruction as necessary with pinning metacarpal phalangeal joint ULNAR COLLATERAL LIGAMENT REPAIR;  Surgeon: Roseanne Kaufman, MD;  Location: [facility];  Service: Orthopedics;  Laterality: Right;    Gynecologic History: No LMP recorded. Patient has had a hysterectomy.  Obstetric History: G1P0  Family History:  Family History  Problem Relation Age of Onset  . Lung cancer Mother   . Diabetes Mother   . Hypertension Mother   . Colon cancer Father   . Diabetes Father     Social History:  Social History   Social History  . Marital status: Divorced    Spouse name: N/A  . Number of children: N/A  . Years of education: N/A   Occupational History  . Not on file.   Social History Main Topics  . Smoking status: Never Smoker  . Smokeless tobacco: Never Used  . Alcohol use Yes   Comment: rare  . Drug use: No  . Sexual activity: No   Other Topics Concern  . Not on file   Social History Narrative  . No narrative on file    Allergies:  Allergies  Allergen Reactions  . Sulfur Hives    Thirty years ago    Medications: Prior to Admission medications   Medication Sig Start Date End Date Taking? Authorizing Provider  amLODipine (NORVASC) 10 MG tablet Take 10 mg by mouth at bedtime. 01/10/16  Yes [provider]  aspirin EC 81 MG tablet Take 81 mg by mouth daily.   Yes [provider]  Insulin Pen Needle (NOVOFINE) 32G X 6 MM MISC 1 Units by Does not apply route daily. 05/09/16  Yes Malachy Mood, MD  levothyroxine (SYNTHROID, LEVOTHROID) 112 MCG tablet Take 112 mcg by mouth daily before breakfast. 01/22/16  Yes [provider]  Liraglutide -Weight Management (SAXENDA) 18 MG/3ML SOPN Inject 3 mg into the skin daily. 05/09/16  Yes Malachy Mood, MD  lisinopril-hydrochlorothiazide (PRINZIDE,ZESTORETIC) 20-12.5 MG tablet Take 2 tablets by mouth daily. 12/13/15  Yes [provider]    Physical Exam Vitals:  Vitals:   08/09/16 0920  BP: 122/72  Pulse: 83   No LMP recorded. Patient has had a hysterectomy. Filed Weights   08/09/16 0920  Weight: 297 lb (134.7 kg)   Body mass index is 54.32 kg/m. (Down from  315lbs and BMI of 57.61)  General: NAD HEENT: normocephalic, anicteric Thyroid: no enlargement Pulmonary: no increased work of breathing Neurologic: Grossly intact Psychiatric: mood appropriate, affect full  Assessment: 61 y.o. G1P0 No problem-specific Assessment & Plan notes found for this encounter.   Plan: Problem List Items Addressed This Visit    None    Visit Diagnoses    Class 3 obesity without serious comorbidity with body mass index (BMI) of 50.0 to 59.9 in adult, unspecified obesity type ([place])    -  Primary   Relevant Medications   Liraglutide -Weight Management (SAXENDA) 18 MG/3ML SOPN   BMI  50.0-59.9, adult ([place])       Relevant Medications   Liraglutide -Weight Management (SAXENDA) 18 MG/3ML SOPN      1) 1500 Calorie ADA Diet  2) Patient education given regarding appropriate lifestyle changes for weight loss including: regular physical activity, healthy coping strategies, caloric restriction and healthy eating patterns.  3) Patient will be started on weight loss medication. The risks and benefits and side effects of medication, such as Adipex (Phenteramine) ,  Tenuate (Diethylproprion), Belviq (lorcarsin), Contrave (buproprion/naltrexone), Qsymia (phentermine/topiramate), and Saxenda (liraglutide) is discussed. The pros and cons of suppressing appetite and boosting metabolism is discussed. Risks of tolerence and addiction is discussed for selected agents discussed. Use of medicine will ne short term, such as 3-4 months at a time followed by a period of time off of the medicine to avoid these risks and side effects for Adipex, Qsymia, and Tenuate discussed. Pt to call with any negative side effects and agrees to keep follow up appts.  4) Patient to take medication, with the benefits of appetite suppression and metabolism boost d/w pt, along with the side effects and risk factors of long term use that will be avoided with our use of short bursts of therapy. Rx provided. Continue saxena   5) 15 minutes face-to-face; with counseling/coordination of care > 50 percent of visit related to obesity and ongoing management/treatment   6) Follow up in 4 weeks to assess response

## 2016-10-27 ENCOUNTER — Other Ambulatory Visit: Payer: Self-pay | Admitting: Obstetrics and Gynecology

## 2016-10-27 NOTE — Telephone Encounter (Signed)
Pt has a weight check on 9/7.Please advise

## 2016-11-09 ENCOUNTER — Ambulatory Visit: Payer: BC Managed Care – PPO | Admitting: Obstetrics and Gynecology

## 2016-11-10 ENCOUNTER — Ambulatory Visit: Payer: BC Managed Care – PPO | Admitting: Obstetrics and Gynecology

## 2016-11-20 ENCOUNTER — Encounter: Payer: Self-pay | Admitting: Obstetrics and Gynecology

## 2016-11-20 ENCOUNTER — Ambulatory Visit (INDEPENDENT_AMBULATORY_CARE_PROVIDER_SITE_OTHER): Payer: BC Managed Care – PPO | Admitting: Obstetrics and Gynecology

## 2016-11-20 VITALS — BP 136/80 | HR 98 | Ht 62.0 in | Wt 302.0 lb

## 2016-11-20 DIAGNOSIS — E669 Obesity, unspecified: Secondary | ICD-10-CM

## 2016-11-20 DIAGNOSIS — Z6841 Body Mass Index (BMI) 40.0 and over, adult: Secondary | ICD-10-CM | POA: Diagnosis not present

## 2016-11-20 DIAGNOSIS — IMO0001 Reserved for inherently not codable concepts without codable children: Secondary | ICD-10-CM

## 2016-11-20 NOTE — Progress Notes (Signed)
Gynecology Office Visit  Chief Complaint:  Chief Complaint  Patient presents with  . Weight Loss    History of Present Illness: Patientis a 61 y.o. G1P0 female, who presents for the evaluation of the desire to lose weight. She has GAINED 4pounds. The patient states the following symptoms since starting her weight loss therapy: appetite suppression, energy, and weight loss.  The patient also reports no other ill effects. The patient specifically denies heart palpitations, anxiety, and insomnia.    Review of Systems: 10 point review of systems negative unless otherwise noted in HPI  Past Medical History:  Past Medical History:  Diagnosis Date  . 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 Hypertension   . Hypothyroidism   . Obesity   . PONV (postoperative nausea and vomiting)   . Sleep apnea    wears CPAP    Past Surgical History:  Past Surgical History:  Procedure Laterality Date  . ABDOMINAL HYSTERECTOMY    . CESAREAN SECTION    . COLONOSCOPY W/ BIOPSIES AND POLYPECTOMY    . thumb surgery  02/2016  . ULNAR COLLATERAL LIGAMENT REPAIR Right 02/10/2016   Procedure: Rt thumb Ulnar collateral ligament reconstruction as necessary with pinning metacarpal phalangeal joint ULNAR COLLATERAL LIGAMENT REPAIR;  Surgeon: Roseanne Kaufman, MD;  Location: [facility];  Service: Orthopedics;  Laterality: Right;    Gynecologic History: No LMP recorded. Patient has had a hysterectomy.  Obstetric History: G1P0  Family History:  Family History  Problem Relation Age of Onset  . Lung cancer Mother   . Diabetes Mother   . Hypertension Mother   . Colon cancer Father   . Diabetes Father     Social History:  Social History   Social History  . Marital status: Divorced    Spouse name: N/A  . Number of children: N/A  . Years of education: N/A   Occupational History  . Not on file.   Social History Main Topics  . Smoking status: Never Smoker  . Smokeless tobacco: Never Used   . Alcohol use Yes     Comment: rare  . Drug use: No  . Sexual activity: No   Other Topics Concern  . Not on file   Social History Narrative  . No narrative on file    Allergies:  Allergies  Allergen Reactions  . Sulfur Hives    Thirty years ago    Medications: Prior to Admission medications   Medication Sig Start Date End Date Taking? Authorizing Provider  amLODipine (NORVASC) 10 MG tablet Take 10 mg by mouth at bedtime. 01/10/16  Yes [provider]  aspirin EC 81 MG tablet Take 81 mg by mouth daily.   Yes [provider]  Insulin Pen Needle (NOVOFINE) 32G X 6 MM MISC 1 Units by Does not apply route daily. 08/09/16  Yes Malachy Mood, MD  levothyroxine (SYNTHROID, LEVOTHROID) 112 MCG tablet Take 112 mcg by mouth daily before breakfast. 01/22/16  Yes [provider]  lisinopril-hydrochlorothiazide (PRINZIDE,ZESTORETIC) 20-12.5 MG tablet Take 2 tablets by mouth daily. 12/13/15  Yes [provider]  SAXENDA 18 MG/3ML SOPN INJECT 3 MG UNDER THE SKIN DAILY 10/27/16  Yes Malachy Mood, MD    Physical Exam Blood pressure 136/80, pulse 98, height 5\' 2"  (1.575 m), weight (!) 302 lb (137 kg). Body mass index is 55.24 kg/m.  General: NAD HEENT: normocephalic, anicteric Thyroid: no enlargement Pulmonary: no increased work of breathing Neurologic: Grossly intact  Psychiatric: mood appropriate, affect full  Assessment: 61 y.o. G1P0 No problem-specific Assessment & Plan notes found for this encounter.   Plan: Problem List Items Addressed This Visit    None     CONTINUE SAXENDA AND LIFETSTYLES REFERRAL, Had good weight loss first 3 months.  1) 1500 Calorie ADA Diet  2) Patient education given regarding appropriate lifestyle changes for weight loss including: regular physical activity, healthy coping strategies, caloric restriction and healthy eating patterns.  3) Patient will be started on weight loss medication. The risks and benefits  and side effects of medication, such as Adipex (Phenteramine) ,  Tenuate (Diethylproprion), Belviq (lorcarsin), Contrave (buproprion/naltrexone), Qsymia (phentermine/topiramate), and Saxenda (liraglutide) is discussed. The pros and cons of suppressing appetite and boosting metabolism is discussed. Risks of tolerence and addiction is discussed for selected agents discussed. Use of medicine will ne short term, such as 3-4 months at a time followed by a period of time off of the medicine to avoid these risks and side effects for Adipex, Qsymia, and Tenuate discussed. Pt to call with any negative side effects and agrees to keep follow up appts.  4) Patient to take medication, with the benefits of appetite suppression and metabolism boost d/w pt, along with the side effects and risk factors of long term use that will be avoided with our use of short bursts of therapy. Rx provided.    5) 15 minutes face-to-face; with counseling/coordination of care > 50 percent of visit related to obesity and ongoing management/treatment   6) Follow up in 12 weeks to assess response

## 2016-11-30 ENCOUNTER — Encounter: Payer: BC Managed Care – PPO | Attending: Obstetrics and Gynecology | Admitting: Dietician

## 2016-11-30 ENCOUNTER — Encounter: Payer: Self-pay | Admitting: Dietician

## 2016-11-30 VITALS — Ht 62.0 in | Wt 311.3 lb

## 2016-11-30 DIAGNOSIS — Z6841 Body Mass Index (BMI) 40.0 and over, adult: Secondary | ICD-10-CM | POA: Diagnosis not present

## 2016-11-30 NOTE — Patient Instructions (Addendum)
-   Healthy recipe swaps  Crockpot: chicken + low sodium chicken broth + jar of salsa + [place] seasoning (make into quesadillas, tacos, eat as is-- add peppers/onions/ corn/ etc if desired), chili with low sodium broth  Sheetpan vegetables in the oven: coat in olive oil and dry seasonings, roast at 400 degrees for 20-52min  Stir fry: vegetables, low sodium soy sauce, protein of choice  [employer] brand vegetable pastas, vegetable, veggie/grain meals  Mix zucchini noodles or other vegetables into pasta dishes  Using whole grain crackers or bread for toppings on casseroles  Foil pack meals (veggies, meat) in the oven   Sub quinoa for breadcrumb/ shake and bake coating on meats  -Look for [employer] books -16 cups water/ day minimum -Try to cut down on the amount of coffee creamer used each day. You can do this by several ways: cutting down the portion size (measure it out!), finding a substitute like almond milk creamer -Remember consistency is KEY! One small goal at a time.

## 2016-11-30 NOTE — Progress Notes (Signed)
Medical Nutrition Therapy: Visit start time: 1000  end time: 1100  Assessment:  Diagnosis: obesity Past medical history: HTN, Hypothyroidism, sleep apnea, see chart Psychosocial issues/ stress concerns: one Preferred learning method:  . Auditory . Visual . Hands-on  Current weight: 311.3lb  Height: 5\' 2"  Medications, supplements: Insulin, Synthroid, Prinzide, see chart.  Progress and evaluation: Patient's main interest is weight reduction. Reports recent h/o successful weight loss but is currently unable to lose weight. Interventions to date include eating fried foods rarely, not adding salt to foods, not drinking sodas, closer monitoring of portion sizes, limiting snacking, choosing ground [place] over ground beef, not keeping sweets in the house, drinking more water daily (24-48oz/day), trying to cut back on total daily calories consumed. Describes herself as a picky eater and often forgets to eat, particularly lunch. States that since she retired it has been difficult to form a consistent schedule, making remembering to eat and continuing to make healthy choices difficult. Struggles with consistency. Also reports being a picky eater. She would like to make healthy changes for not only herself but for her daughter and granddaughter who live with her and cook meals with her.    Physical activity: trying to walk more with her church friend but "needs to do more"   Dietary Intake:  Usual eating pattern includes 3 meals and 0-1 snacks per day. Dining out frequency: 8-9 meals per week.  Breakfast: cheese toast or ham and cheese toast, coffee with over a serving of creamer Snack: n/a Lunch "out" 2x/wk: salad, meat + vegetable Lunch "at home": deli meat, cucumbers or nothing Snack: n/a Supper: chicken, chef salad, steak, hamburger steak, vegetables, baked potato occasionally Snack: occasionally small portion of ice cream Beverages: water, crystal light, coffee  Nutrition Care  Education: Topics covered: healthy recipes and ingredient swaps, portion control, fluid intake, consistency with lifestyle changes, lower fat food options, smarter shopping, alternative options for coffee creamer such as almond milk-based creamer Basic nutrition: basic food groups, appropriate nutrient balance, appropriate meal and snack schedule, general nutrition guidelines    Weight control: benefits of weight control, behavioral changes for weight loss Advanced nutrition:  recipe modification, cooking techniques, dining out, food label reading Hyperlipidemia: healthy and unhealthy fats, role of fiber Other lifestyle changes:  benefits of making changes, increasing motivation, readiness for change, identifying habits that need to change  Nutritional Diagnosis:  [place]-3.3 Overweight/obesity As related to lifestyle habits.  As evidenced by BMI 56.9.  Intervention: Discussion as noted above. Goals established; patient to work on incorporating 1-2 small goals at a time into her daily life, starting with cutting down on coffee creamer or finding a lower calorie substitute, and incorporating new healthy recipes discussed into her family's weekly dinner meals.  Education Materials given:  . General diet guidelines for weight loss . Recipes . Goals/ instructions  Learner/ who was taught:  . Patient  Level of understanding: . Unable to understand/ needs instruction  Demonstrated degree of understanding via:   Teach back Learning barriers: . None  Willingness to learn/ readiness for change: . Eager, change in progress  Monitoring and Evaluation:  Dietary intake, exercise, and body weight      follow up: prn

## 2016-12-14 ENCOUNTER — Other Ambulatory Visit: Payer: Self-pay | Admitting: Obstetrics and Gynecology

## 2016-12-14 NOTE — Telephone Encounter (Signed)
Appointment 02/21/2017 for medication follow up. Please advise

## 2016-12-15 ENCOUNTER — Other Ambulatory Visit: Payer: Self-pay | Admitting: Obstetrics and Gynecology

## 2017-01-15 ENCOUNTER — Inpatient Hospital Stay
Admission: RE | Admit: 2017-01-15 | Discharge: 2017-01-15 | Disposition: A | Discharge status: Home / Self Care | Payer: Self-pay | Source: Ambulatory Visit | Attending: *Deleted | Admitting: *Deleted

## 2017-01-15 ENCOUNTER — Other Ambulatory Visit: Payer: Self-pay | Admitting: *Deleted

## 2017-01-15 DIAGNOSIS — Z9289 Personal history of other medical treatment: Secondary | ICD-10-CM

## 2017-01-16 ENCOUNTER — Other Ambulatory Visit: Payer: Self-pay | Admitting: Family Medicine

## 2017-01-16 DIAGNOSIS — Z1231 Encounter for screening mammogram for malignant neoplasm of breast: Secondary | ICD-10-CM

## 2017-01-18 ENCOUNTER — Other Ambulatory Visit: Payer: Self-pay | Admitting: Obstetrics and Gynecology

## 2017-01-18 NOTE — Telephone Encounter (Signed)
Follow up appointment scheduled for 12/19. Please advise

## 2017-02-07 ENCOUNTER — Ambulatory Visit
Admission: RE | Admit: 2017-02-07 | Discharge: 2017-02-07 | Disposition: A | Discharge status: Home / Self Care | Payer: BC Managed Care – PPO | Source: Ambulatory Visit | Attending: Family Medicine | Admitting: Family Medicine

## 2017-02-07 DIAGNOSIS — Z1231 Encounter for screening mammogram for malignant neoplasm of breast: Secondary | ICD-10-CM | POA: Diagnosis present

## 2017-02-20 NOTE — Progress Notes (Signed)
Gynecology Office Visit  Chief Complaint:  Chief Complaint  Patient presents with  . Medication follow up    Weigth loss    History of Present Illness: Patientis a 61 y.o. G1P0 female, who presents for the evaluation of the desire to lose weight. She was started on Saxenda .  She initially lost 18lbs in the first 3 months, but gained 4lbs back in the next 3 months.  Given continued apetite suppression patient elected to continue with Saxenda.  She has lost 3 pounds. The patient states the following symptoms since starting her weight loss therapy: appetite suppression, energy, and weight loss.  The patient also reports no other ill effects. The patient specifically denies heart palpitations, anxiety, and insomnia.    Review of Systems: 10 point review of systems negative unless otherwise noted in HPI  Past Medical History:  Past Medical History:  Diagnosis Date  . 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 Hypertension   . Hypothyroidism   . Obesity   . PONV (postoperative nausea and vomiting)   . Sleep apnea    wears CPAP    Past Surgical History:  Past Surgical History:  Procedure Laterality Date  . ABDOMINAL HYSTERECTOMY    . CESAREAN SECTION    . COLONOSCOPY W/ BIOPSIES AND POLYPECTOMY    . thumb surgery  02/2016  . ULNAR COLLATERAL LIGAMENT REPAIR Right 02/10/2016   Procedure: Rt thumb Ulnar collateral ligament reconstruction as necessary with pinning metacarpal phalangeal joint ULNAR COLLATERAL LIGAMENT REPAIR;  Surgeon: Roseanne Kaufman, MD;  Location: [place];  Service: Orthopedics;  Laterality: Right;    Gynecologic History: No LMP recorded. Patient has had a hysterectomy.  Obstetric History: G1P0  Family History:  Family History  Problem Relation Age of Onset  . Lung cancer Mother   . Diabetes Mother   . Hypertension Mother   . Colon cancer Father   . Diabetes Father   . Breast cancer Neg Hx     Social History:  Social History    Socioeconomic History  . Marital status: Divorced    Spouse name: Not on file  . Number of children: Not on file  . Years of education: Not on file  . Highest education level: Not on file  Social Needs  . Financial resource strain: Not on file  . Food insecurity - worry: Not on file  . Food insecurity - inability: Not on file  . Transportation needs - medical: Not on file  . Transportation needs - non-medical: Not on file  Occupational History  . Not on file  Tobacco Use  . Smoking status: Never Smoker  . Smokeless tobacco: Never Used  Substance and Sexual Activity  . Alcohol use: Yes    Comment: rare  . Drug use: No  . Sexual activity: No  Other Topics Concern  . Not on file  Social History Narrative  . Not on file    Allergies:  Allergies  Allergen Reactions  . Sulfur Hives    Thirty years ago    Medications: Prior to Admission medications   Medication Sig Start Date End Date Taking? Authorizing Provider  amLODipine (NORVASC) 10 MG tablet Take 10 mg by mouth at bedtime. 01/10/16   [provider]  aspirin EC 81 MG tablet Take 81 mg by mouth daily.    [provider]  Insulin Pen Needle (NOVOFINE) 32G X 6 MM MISC 1 Units by Does not apply route  daily. 08/09/16   Malachy Mood, MD  ipratropium (ATROVENT) 0.03 % nasal spray  11/20/16   [provider]  levothyroxine (SYNTHROID, LEVOTHROID) 112 MCG tablet Take 112 mcg by mouth daily before breakfast. 01/22/16   [provider]  lisinopril-hydrochlorothiazide (PRINZIDE,ZESTORETIC) 20-12.5 MG tablet Take 2 tablets by mouth daily. 12/13/15   [provider]  SAXENDA 18 MG/3ML SOPN INJECT 3 MG UNDER THE SKIN EVERY DAY 01/18/17   Malachy Mood, MD    Physical Exam Blood pressure 130/70, pulse 78, height 5\' 2"  (1.575 m), weight 299 lb (135.6 kg). Body mass index is 54.69 kg/m.  General: NAD HEENT: normocephalic, anicteric Thyroid: no enlargement Pulmonary: no increased  work of breathing Neurologic: Grossly intact Psychiatric: mood appropriate, affect full  Assessment: 61 y.o. G1P0 No problem-specific Assessment & Plan notes found for this encounter.   Plan: Problem List Items Addressed This Visit    None    Visit Diagnoses    Class 3 severe obesity without serious comorbidity with body mass index (BMI) of 50.0 to 59.9 in adult, unspecified obesity type ([place])    -  Primary      1) 1500 Calorie ADA Diet  2) Patient education given regarding appropriate lifestyle changes for weight loss including: regular physical activity, healthy coping strategies, caloric restriction and healthy eating patterns.  3) Patient will be started on weight loss medication. The risks and benefits and side effects of medication, such as Adipex (Phenteramine) ,  Tenuate (Diethylproprion), Belviq (lorcarsin), Contrave (buproprion/naltrexone), Qsymia (phentermine/topiramate), and Saxenda (liraglutide) is discussed. The pros and cons of suppressing appetite and boosting metabolism is discussed. Risks of tolerence and addiction is discussed for selected agents discussed. Use of medicine will ne short term, such as 3-4 months at a time followed by a period of time off of the medicine to avoid these risks and side effects for Adipex, Qsymia, and Tenuate discussed. Pt to call with any negative side effects and agrees to keep follow up appts.  4) Continue Saxenda  5) 15 minutes face-to-face; with counseling/coordination of care > 50 percent of visit related to obesity and ongoing management/treatment   6) Follow up in 4 weeks to assess response

## 2017-02-21 ENCOUNTER — Encounter: Payer: Self-pay | Admitting: Obstetrics and Gynecology

## 2017-02-21 ENCOUNTER — Ambulatory Visit (INDEPENDENT_AMBULATORY_CARE_PROVIDER_SITE_OTHER): Payer: BC Managed Care – PPO | Admitting: Obstetrics and Gynecology

## 2017-02-21 VITALS — BP 130/70 | HR 78 | Ht 62.0 in | Wt 299.0 lb

## 2017-02-21 DIAGNOSIS — Z6841 Body Mass Index (BMI) 40.0 and over, adult: Secondary | ICD-10-CM

## 2017-02-21 MED ORDER — INSULIN PEN NEEDLE 32G X 6 MM MISC: 1.0000 [IU] | Freq: Every day | 3 refills | Status: DC

## 2017-02-21 MED ORDER — LIRAGLUTIDE -WEIGHT MANAGEMENT 18 MG/3ML ~~LOC~~ SOPN: 3.0000 mg | PEN_INJECTOR | Freq: Every day | SUBCUTANEOUS | 6 refills | Status: DC

## 2017-02-21 NOTE — Patient Instructions (Signed)
Contrave

## 2017-05-23 ENCOUNTER — Ambulatory Visit: Payer: BC Managed Care – PPO | Admitting: Obstetrics and Gynecology

## 2018-01-02 ENCOUNTER — Other Ambulatory Visit: Payer: Self-pay | Admitting: Family Medicine

## 2018-01-02 DIAGNOSIS — Z1231 Encounter for screening mammogram for malignant neoplasm of breast: Secondary | ICD-10-CM

## 2018-06-07 IMAGING — MG MM DIGITAL SCREENING BILAT W/ CAD
6 series · 6 of 6 positions shown · non-contrast
Comparison: Previous exam(s).

CLINICAL DATA: Screening.

EXAM:
DIGITAL SCREENING BILATERAL MAMMOGRAM WITH CAD

[R MLO]
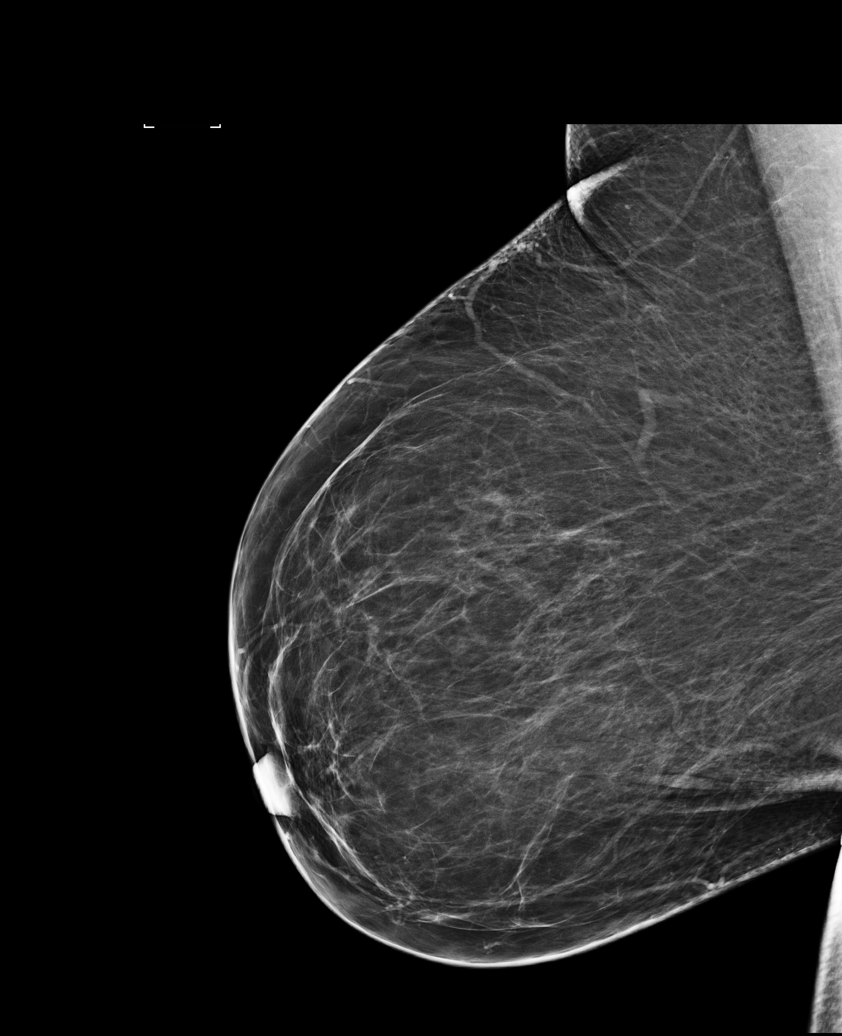

[R CC (1 of 2)]
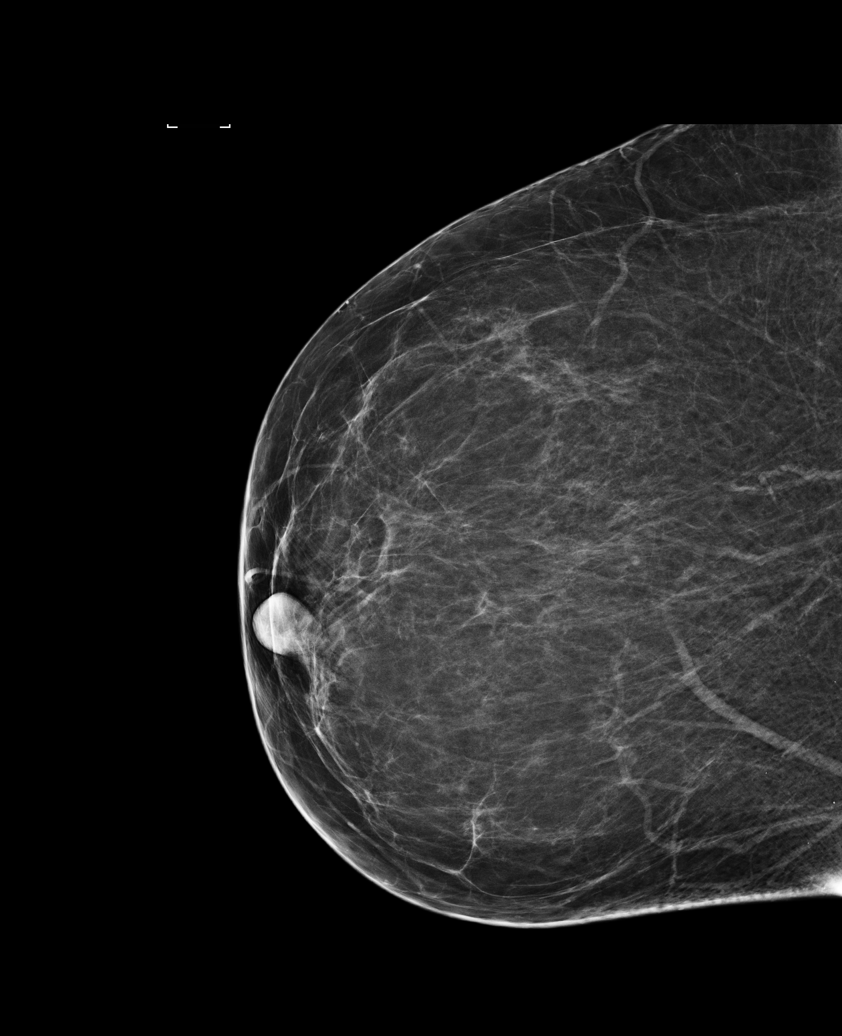

[R CC (2 of 2)]
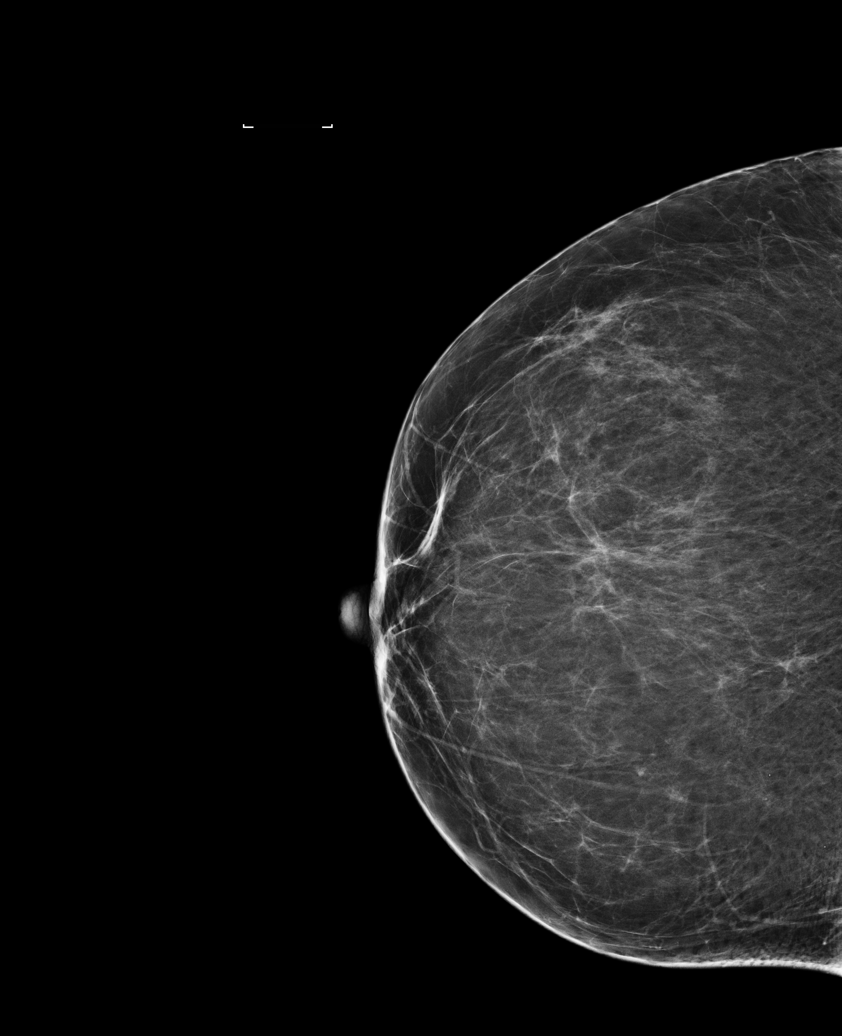

[L CC (1 of 2)]
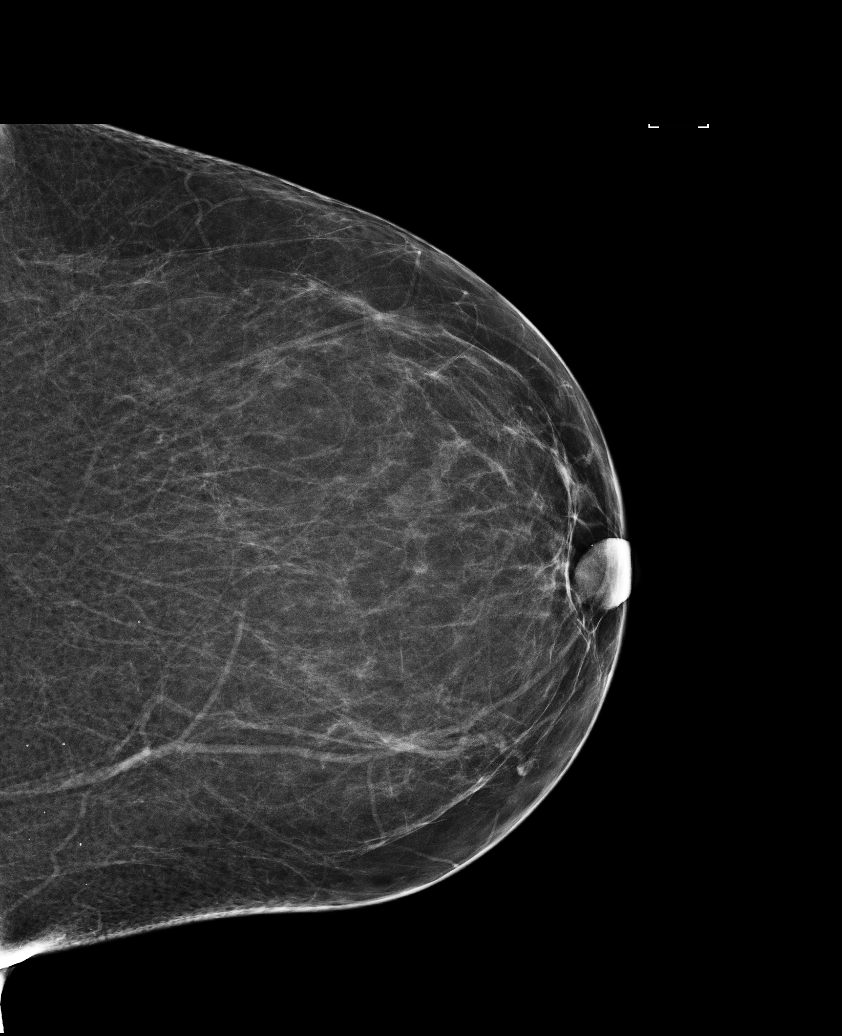

[L CC (2 of 2)]
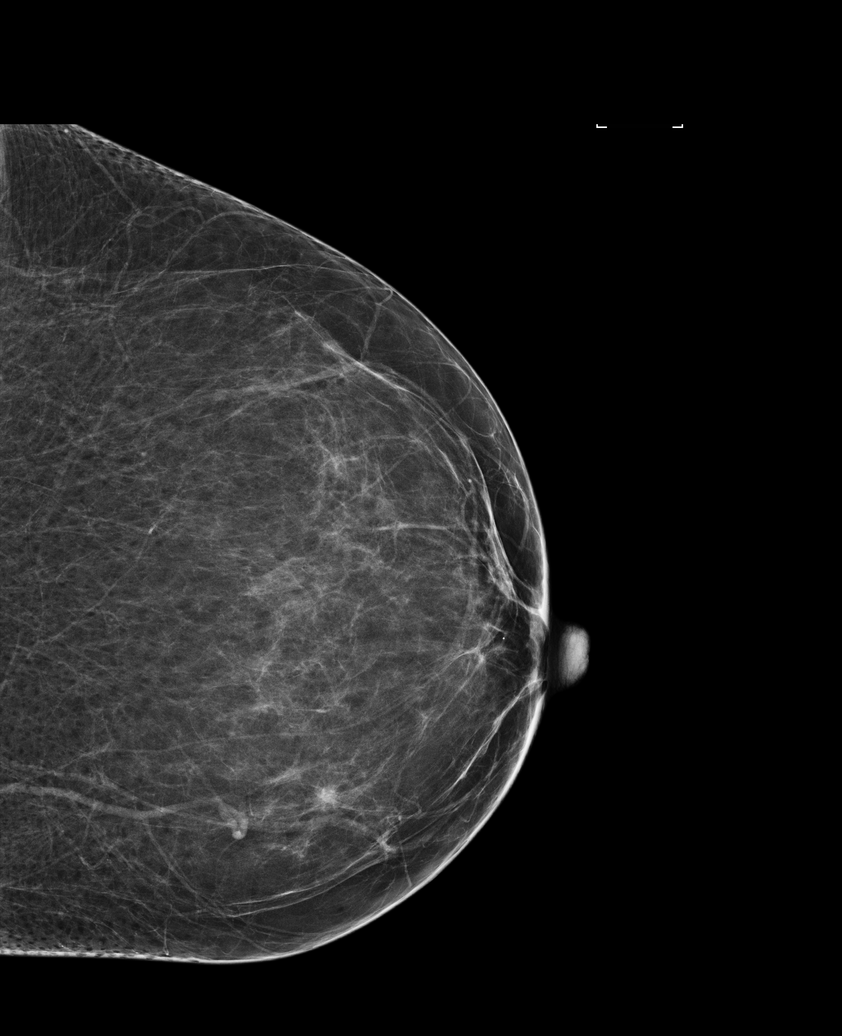

[L MLO]
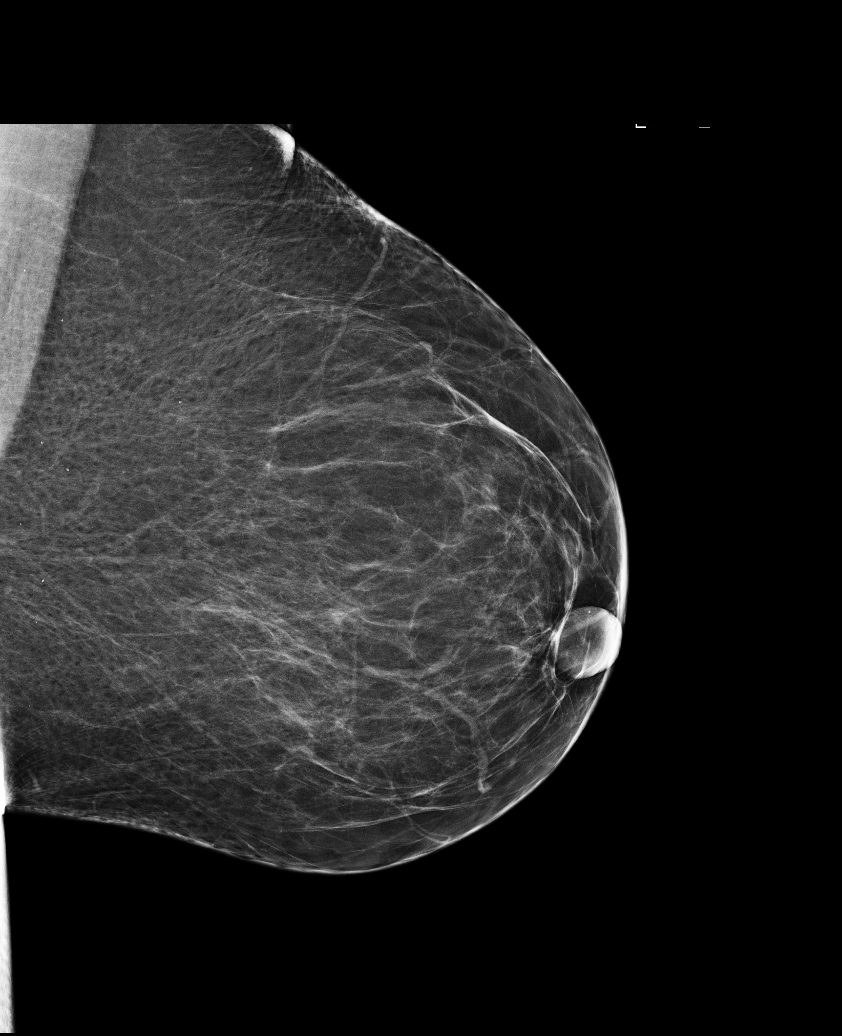

[6 of 6 positions shown; findings below may reference images not displayed]

ACR Breast Density Category b: There are scattered areas of
fibroglandular density.
FINDINGS: There are no findings suspicious for malignancy. Images were
processed with CAD.
IMPRESSION: No mammographic evidence of malignancy. A result letter of this
screening mammogram will be mailed directly to the patient.

RECOMMENDATION:
Screening mammogram in one year. (Code:AS-G-LCT)

BI-RADS CATEGORY  1: Negative.

## 2019-04-03 ENCOUNTER — Other Ambulatory Visit: Payer: Self-pay | Admitting: Family Medicine

## 2019-04-03 DIAGNOSIS — Z1231 Encounter for screening mammogram for malignant neoplasm of breast: Secondary | ICD-10-CM

## 2019-05-06 ENCOUNTER — Ambulatory Visit
Admission: RE | Admit: 2019-05-06 | Discharge: 2019-05-06 | Disposition: A | Discharge status: Home / Self Care | Payer: BC Managed Care – PPO | Source: Ambulatory Visit | Attending: Family Medicine | Admitting: Family Medicine

## 2019-05-06 DIAGNOSIS — Z1231 Encounter for screening mammogram for malignant neoplasm of breast: Secondary | ICD-10-CM | POA: Insufficient documentation

## 2019-05-06 DIAGNOSIS — C4491 Basal cell carcinoma of skin, unspecified: Secondary | ICD-10-CM

## 2019-05-06 HISTORY — DX: Basal cell carcinoma of skin, unspecified: C44.91

## 2019-06-30 ENCOUNTER — Other Ambulatory Visit: Payer: Self-pay

## 2019-06-30 ENCOUNTER — Ambulatory Visit: Payer: BC Managed Care – PPO | Admitting: Dermatology

## 2019-06-30 DIAGNOSIS — C44619 Basal cell carcinoma of skin of left upper limb, including shoulder: Secondary | ICD-10-CM

## 2019-06-30 NOTE — Patient Instructions (Signed)

## 2019-06-30 NOTE — Progress Notes (Signed)
   Follow-Up Visit   Subjective  Brandi Henry is a 64 y.o. female who presents for the following: BCC bx proven (L mid forearm, bx 05/06/2019).   The following portions of the chart were reviewed this encounter and updated as appropriate:      Review of Systems:  No other skin or systemic complaints except as noted in HPI or Assessment and Plan.  Objective  Well appearing patient in no apparent distress; mood and affect are within normal limits.  A focused examination was performed including L arm. Relevant physical exam findings are noted in the Assessment and Plan.  Objective  L mid forearm: Pink bx site 1.8 x 1.0cm   Assessment & Plan  Basal cell carcinoma (BCC) of skin of left upper extremity including shoulder L mid forearm  Skin excision  Lesion length (cm):  1.8 Lesion width (cm):  1 Margin per side (cm):  0.2 Total excision diameter (cm):  2.2 Informed consent: discussed and consent obtained   Timeout: patient name, date of birth, surgical site, and procedure verified   Procedure prep:  Patient was prepped and draped in usual sterile fashion Prep type:  Povidone-iodine Anesthesia: the lesion was anesthetized in a standard fashion   Anesthetic:  1% lidocaine w/ epinephrine 1-100,000 buffered w/ 8.4% NaHCO3 (16cc) Instrument used: #15 blade   Hemostasis achieved with: pressure and electrodesiccation   Outcome: patient tolerated procedure well with no complications    Skin repair Complexity:  Intermediate Final length (cm):  5 Reason for type of repair: reduce tension to allow closure   Undermining: edges could be approximated without difficulty and edges undermined   Subcutaneous layers (deep stitches):  Suture size:  3-0 Suture type: Vicryl (polyglactin 910)   Stitches:  Buried vertical mattress Fine/surface layer approximation (top stitches):  Suture size:  4-0 Suture type comment:  Nylon Stitches: simple interrupted   Suture removal (days):  7  Hemostasis achieved with: suture Outcome: patient tolerated procedure well with no complications   Post-procedure details: sterile dressing applied and wound care instructions given   Dressing type: pressure dressing (Mupirocin)    Specimen 1 - Surgical pathology Differential Diagnosis: C44.619 BCC Check Margins: No Pink bx site Tag at 3:00 lateral tip  Return in about 1 week (around 07/07/2019) for sr.    Documentation: I have reviewed the above documentation for accuracy and completeness, and I agree with the above.  [name], MD   I, Othelia Pulling, RMA, am acting as scribe for [name], MD .

## 2019-07-01 ENCOUNTER — Telehealth: Payer: Self-pay

## 2019-07-01 NOTE — Telephone Encounter (Signed)
Pt doing well after yesterdays surgery.  She said she was just a little sore.  I discussed tylenol/ibuprofen pain regimen that was given to her yesterday./sh

## 2019-07-07 ENCOUNTER — Ambulatory Visit (INDEPENDENT_AMBULATORY_CARE_PROVIDER_SITE_OTHER): Payer: BC Managed Care – PPO | Admitting: Dermatology

## 2019-07-07 ENCOUNTER — Other Ambulatory Visit: Payer: Self-pay

## 2019-07-07 DIAGNOSIS — Z4802 Encounter for removal of sutures: Secondary | ICD-10-CM

## 2019-07-07 DIAGNOSIS — C44619 Basal cell carcinoma of skin of left upper limb, including shoulder: Secondary | ICD-10-CM

## 2019-07-07 NOTE — Patient Instructions (Signed)
Recommend daily broad spectrum sunscreen SPF 30+ to sun-exposed areas, reapply every 2 hours as needed. Call for new or changing lesions.  

## 2019-07-07 NOTE — Progress Notes (Signed)
   Follow-Up Visit   Subjective  Brandi Henry is a 64 y.o. female who presents for the following: Follow-up.  Patient here today for suture removal.   The following portions of the chart were reviewed this encounter and updated as appropriate:     Review of Systems:  No other skin or systemic complaints except as noted in HPI or Assessment and Plan.  Objective  Well appearing patient in no apparent distress; mood and affect are within normal limits.  A focused examination was performed including left arm. Relevant physical exam findings are noted in the Assessment and Plan.  Objective  Left Forearm: Well healing excision site    Assessment & Plan  Basal cell carcinoma of skin of left upper limb, including shoulder Left Forearm  Margins free  Encounter for Removal of Sutures - Incision site at the left forearm is clean, dry and intact - Wound cleansed, sutures removed, wound cleansed and steri strips applied.  - Discussed pathology results showing margins free  - Patient advised to keep steri-strips dry until they fall off. - Scars remodel for a full year. - Once steri-strips fall off, patient can apply over-the-counter silicone scar cream each night to help with scar remodeling if desired. - Patient advised to call with any concerns or if they notice any new or changing lesions.   Return in about 6 months (around 01/07/2020) for TBSE.  Graciella Belton, RMA, am acting as scribe for [name], MD .  Documentation: I have reviewed the above documentation for accuracy and completeness, and I agree with the above.  [name], MD

## 2020-01-12 ENCOUNTER — Ambulatory Visit: Payer: BC Managed Care – PPO | Admitting: Dermatology

## 2020-01-12 ENCOUNTER — Other Ambulatory Visit: Payer: Self-pay

## 2020-01-12 DIAGNOSIS — L578 Other skin changes due to chronic exposure to nonionizing radiation: Secondary | ICD-10-CM

## 2020-01-12 DIAGNOSIS — D229 Melanocytic nevi, unspecified: Secondary | ICD-10-CM

## 2020-01-12 DIAGNOSIS — L570 Actinic keratosis: Secondary | ICD-10-CM

## 2020-01-12 DIAGNOSIS — Z85828 Personal history of other malignant neoplasm of skin: Secondary | ICD-10-CM | POA: Diagnosis not present

## 2020-01-12 DIAGNOSIS — L820 Inflamed seborrheic keratosis: Secondary | ICD-10-CM

## 2020-01-12 DIAGNOSIS — L821 Other seborrheic keratosis: Secondary | ICD-10-CM

## 2020-01-12 DIAGNOSIS — D1800 Hemangioma unspecified site: Secondary | ICD-10-CM

## 2020-01-12 DIAGNOSIS — Z1283 Encounter for screening for malignant neoplasm of skin: Secondary | ICD-10-CM | POA: Diagnosis not present

## 2020-01-12 DIAGNOSIS — L249 Irritant contact dermatitis, unspecified cause: Secondary | ICD-10-CM | POA: Diagnosis not present

## 2020-01-12 DIAGNOSIS — L853 Xerosis cutis: Secondary | ICD-10-CM

## 2020-01-12 DIAGNOSIS — L918 Other hypertrophic disorders of the skin: Secondary | ICD-10-CM

## 2020-01-12 DIAGNOSIS — D2371 Other benign neoplasm of skin of right lower limb, including hip: Secondary | ICD-10-CM

## 2020-01-12 DIAGNOSIS — L814 Other melanin hyperpigmentation: Secondary | ICD-10-CM

## 2020-01-12 MED ORDER — TRIAMCINOLONE ACETONIDE 0.1 % EX CREA: TOPICAL_CREAM | CUTANEOUS | 0 refills | Status: DC

## 2020-01-12 NOTE — Progress Notes (Signed)
Follow-Up Visit   Subjective  Brandi Henry is a 64 y.o. female who presents for the following: TBSE (6 month follow-up, Hx BCC left forearm.). No spots of concern today.  She has itchy rash on lower back and R foot (recent bug bite) and an irritated growth on her side that gets caught on clothing.   The following portions of the chart were reviewed this encounter and updated as appropriate:      Review of Systems:  No other skin or systemic complaints except as noted in HPI or Assessment and Plan.  Objective  Well appearing patient in no apparent distress; mood and affect are within normal limits.  A full examination was performed including scalp, head, eyes, ears, nose, lips, neck, chest, axillae, abdomen, back, buttocks, bilateral upper extremities, bilateral lower extremities, hands, feet, fingers, toes, fingernails, and toenails. All findings within normal limits unless otherwise noted below.  Objective  Left Mid Forearm: Well healed scar with no evidence of recurrence.   Objective  Left Hand Dorsum: Pink scaly papule.  Objective  Lower Back, Right Foot Dorsum: Pink scaly patch on right foot dorsum; horizontal linear pink scaly patch with excoriations on spinal lower back.  Objective  Left Flank: Erythematous keratotic or waxy stuck-on papule    Assessment & Plan   Skin cancer screening performed today.  Actinic Damage - chronic, secondary to cumulative UV radiation exposure/sun exposure over time - diffuse scaly erythematous macules with underlying dyspigmentation - Recommend daily broad spectrum sunscreen SPF 30+ to sun-exposed areas, reapply every 2 hours as needed.  - Call for new or changing lesions.  Seborrheic Keratoses - Stuck-on, waxy, tan-brown papules and plaques  - Discussed benign etiology and prognosis. - Observe - Call for any changes  Melanocytic Nevi - Tan-brown and/or pink-flesh-colored symmetric macules and papules - Benign appearing on  exam today - Observation - Call clinic for new or changing moles - Recommend daily use of broad spectrum spf 30+ sunscreen to sun-exposed areas.   Lentigines - Scattered tan macules - Discussed due to sun exposure - Benign, observe - Call for any changes  Hemangiomas - Red papules - Discussed benign nature - Observe - Call for any changes  Xerosis - diffuse xerotic patches - recommend gentle, hydrating skin care - gentle skin care handout given  Dermatofibroma - Firm pink/brown papulenodule with dimple sign    on right calf - Benign appearing - Call for any changes  Acrochordons (Skin Tags) - Fleshy, skin-colored pedunculated papules on neck, upper abdomen. - Benign appearing.  - Observe. - If desired, they can be removed with an in office procedure that is not covered by insurance. - Please call the clinic if you notice any new or changing lesions.   History of basal cell carcinoma (BCC) Left Mid Forearm  Clear. Observe for recurrence. Call clinic for new or changing lesions.  Recommend regular skin exams, daily broad-spectrum spf 30+ sunscreen use, and photoprotection.     AK (actinic keratosis) Left Hand Dorsum  AK vs ISK  Destruction of lesion - Left Hand Dorsum  Destruction method: cryotherapy   Informed consent: discussed and consent obtained   Lesion destroyed using liquid nitrogen: Yes   Region frozen until ice ball extended beyond lesion: Yes   Outcome: patient tolerated procedure well with no complications   Post-procedure details: wound care instructions given    Irritant dermatitis Lower Back, Right Foot Dorsum  Secondary to waistband rubbing and recent insect bite  Start [facility]  0.1% Cream Apply to AA qd/bid prn itch  Recommend mild soap and moisturizing cream 1-2 times daily.    triamcinolone cream (KENALOG) 0.1 % - Lower Back, Right Foot Dorsum  Inflamed seborrheic keratosis Left Flank  May need more than one treatment to clear due to  size.  Destruction of lesion - Left Flank  Destruction method: cryotherapy   Informed consent: discussed and consent obtained   Lesion destroyed using liquid nitrogen: Yes   Region frozen until ice ball extended beyond lesion: Yes   Outcome: patient tolerated procedure well with no complications   Post-procedure details: wound care instructions given    Return in about 6 months (around 07/11/2020) for Hx BCC, recheck AK, ISK.   I, Jamesetta Orleans, CMA, am acting as scribe for [name], MD .  Documentation: I have reviewed the above documentation for accuracy and completeness, and I agree with the above.  [name] MD

## 2020-01-12 NOTE — Patient Instructions (Addendum)
Cryotherapy Aftercare  . Wash gently with soap and water everyday.   . Apply Vaseline and Band-Aid daily until healed.   Dry Skin Care  What causes dry skin?  Dry skin is common and results from inadequate moisture in the outer skin layers. Dry skin usually results from the excessive loss of moisture from the skin surface. This occurs due to two major factors: 1. Normally the skin's oil glands deposit a layer of oil on the skin's surface. This layer of oil prevents the loss of moisture from the skin. Exposure to soaps, cleaners, solvents, and disinfectants removes this oily film, allowing water to escape. 2. Water loss from the skin increases when the humidity is low. During winter months we spend a lot of time indoors where the air is heated. Heated air has very low humidity. This also contributes to dry skin.  A tendency for dry skin may accompany such disorders as eczema. Also, as people age, the number of functioning oil glands decreases, and the tendency toward dry skin can be a sensation of skin tightness when emerging from the shower.  How do I manage dry skin?  1. Humidify your environment. This can be accomplished by using a humidifier in your bedroom at night during winter months. 2. Bathing can actually put moisture back into your skin if done right. Take the following steps while bathing to sooth dry skin:  Avoid hot water, which only dries the skin and makes itching worse. Use warm water.  Avoid washcloths or extensive rubbing or scrubbing.  Use mild soaps like unscented Dove, Oil of Olay, Cetaphil, Basis, or CeraVe.  If you take baths rather than showers, rinse off soap residue with clean water before getting out of tub.  Once out of the shower/tub, pat dry gently with a soft towel. Leave your skin damp.  While still damp, apply any medicated ointment/cream you were prescribed to the affected areas. After you apply your medicated ointment/cream, then apply your  moisturizer to your whole body.This is the most important step in dry skin care. If this is omitted, your skin will continue to be dry.  The choice of moisturizer is also very important. In general, lotion will not provider enough moisture to severely dry skin because it is water based. You should use an ointment or cream. Moisturizers should also be unscented. Good choices include Vaseline (plain petrolatum), Aquaphor, Cetaphil, CeraVe, Vanicream, DML Forte, Aveeno moisture, or Eucerin Cream.  Bath oils can be helpful, but do not replace the application of moisturizer after the bath. In addition, they make the tub slippery causing an increased risk for falls. Therefore, we do not recommend their use.   Seborrheic Keratosis  What causes seborrheic keratoses? Seborrheic keratoses are harmless, common skin growths that first appear during adult life.  As time goes by, more growths appear.  Some people may develop a large number of them.  Seborrheic keratoses appear on both covered and uncovered body parts.  They are not caused by sunlight.  The tendency to develop seborrheic keratoses can be inherited.  They vary in color from skin-colored to gray, brown, or even black.  They can be either smooth or have a rough, warty surface.   Seborrheic keratoses are superficial and look as if they were stuck on the skin.  Under the microscope this type of keratosis looks like layers upon layers of skin.  That is why at times the top layer may seem to fall off, but the rest of the   growth remains and re-grows.    Treatment Seborrheic keratoses do not need to be treated, but can easily be removed in the office.  Seborrheic keratoses often cause symptoms when they rub on clothing or jewelry.  Lesions can be in the way of shaving.  If they become inflamed, they can cause itching, soreness, or burning.  Removal of a seborrheic keratosis can be accomplished by freezing, burning, or surgery. If any spot bleeds, scabs, or  grows rapidly, please return to have it checked, as these can be an indication of a skin cancer.  

## 2020-02-05 ENCOUNTER — Other Ambulatory Visit: Payer: BC Managed Care – PPO

## 2020-03-11 ENCOUNTER — Other Ambulatory Visit
Admission: RE | Admit: 2020-03-11 | Discharge: 2020-03-11 | Disposition: A | Discharge status: Home / Self Care | Payer: BC Managed Care – PPO | Source: Ambulatory Visit | Attending: Gastroenterology | Admitting: Gastroenterology

## 2020-03-11 ENCOUNTER — Other Ambulatory Visit: Payer: Self-pay

## 2020-03-11 DIAGNOSIS — Z01812 Encounter for preprocedural laboratory examination: Secondary | ICD-10-CM | POA: Diagnosis not present

## 2020-03-11 DIAGNOSIS — Z20822 Contact with and (suspected) exposure to covid-19: Secondary | ICD-10-CM | POA: Insufficient documentation

## 2020-03-11 LAB — SARS CORONAVIRUS 2 (TAT 6-24 HRS): SARS Coronavirus 2: NEGATIVE

## 2020-03-15 ENCOUNTER — Encounter: Payer: Self-pay | Admitting: *Deleted

## 2020-03-15 ENCOUNTER — Ambulatory Visit
Admission: RE | Admit: 2020-03-15 | Discharge: 2020-03-15 | Disposition: A | Discharge status: Home / Self Care | Payer: BC Managed Care – PPO | Attending: Gastroenterology | Admitting: Gastroenterology

## 2020-03-15 ENCOUNTER — Ambulatory Visit: Payer: BC Managed Care – PPO | Admitting: Certified Registered"

## 2020-03-15 ENCOUNTER — Other Ambulatory Visit: Payer: Self-pay

## 2020-03-15 ENCOUNTER — Encounter
Admission: RE | Disposition: A | Discharge status: Home / Self Care | Payer: Self-pay | Source: Home / Self Care | Attending: Gastroenterology

## 2020-03-15 DIAGNOSIS — Z7982 Long term (current) use of aspirin: Secondary | ICD-10-CM | POA: Insufficient documentation

## 2020-03-15 DIAGNOSIS — Z800 Family history of malignant neoplasm of digestive organs: Secondary | ICD-10-CM | POA: Diagnosis not present

## 2020-03-15 DIAGNOSIS — Z86010 Personal history of colonic polyps: Secondary | ICD-10-CM | POA: Insufficient documentation

## 2020-03-15 DIAGNOSIS — Z79899 Other long term (current) drug therapy: Secondary | ICD-10-CM | POA: Diagnosis not present

## 2020-03-15 DIAGNOSIS — Z85828 Personal history of other malignant neoplasm of skin: Secondary | ICD-10-CM | POA: Diagnosis not present

## 2020-03-15 DIAGNOSIS — Z1211 Encounter for screening for malignant neoplasm of colon: Secondary | ICD-10-CM | POA: Diagnosis not present

## 2020-03-15 DIAGNOSIS — Z882 Allergy status to sulfonamides status: Secondary | ICD-10-CM | POA: Insufficient documentation

## 2020-03-15 DIAGNOSIS — K640 First degree hemorrhoids: Secondary | ICD-10-CM | POA: Diagnosis not present

## 2020-03-15 DIAGNOSIS — Z79890 Hormone replacement therapy (postmenopausal): Secondary | ICD-10-CM | POA: Diagnosis not present

## 2020-03-15 DIAGNOSIS — K5730 Diverticulosis of large intestine without perforation or abscess without bleeding: Secondary | ICD-10-CM | POA: Diagnosis not present

## 2020-03-15 HISTORY — PX: COLONOSCOPY WITH PROPOFOL: SHX5780

## 2020-03-15 HISTORY — DX: Dyspnea, unspecified: R06.00

## 2020-03-15 SURGERY — COLONOSCOPY WITH PROPOFOL
Anesthesia: General

## 2020-03-15 MED ORDER — PROPOFOL 500 MG/50ML IV EMUL
INTRAVENOUS | Status: AC
  Filled 2020-03-15: qty 50

## 2020-03-15 MED ORDER — SODIUM CHLORIDE 0.9 % IV SOLN: INTRAVENOUS | Status: DC

## 2020-03-15 MED ORDER — PROPOFOL 500 MG/50ML IV EMUL
INTRAVENOUS | Status: DC | PRN
  Administered 2020-03-15: 130 ug/kg/min via INTRAVENOUS

## 2020-03-15 MED ORDER — PROPOFOL 10 MG/ML IV BOLUS
INTRAVENOUS | Status: DC | PRN
  Administered 2020-03-15: 20 mg via INTRAVENOUS
  Administered 2020-03-15 (×2): 10 mg via INTRAVENOUS

## 2020-03-15 NOTE — Transfer of Care (Signed)
Immediate Anesthesia Transfer of Care Note  Patient: Brandi Henry  Procedure(s) Performed: COLONOSCOPY WITH PROPOFOL (N/A )  Patient Location: PACU and Endoscopy Unit  Anesthesia Type:General  Level of Consciousness: drowsy  Airway & Oxygen Therapy: Patient Spontanous Breathing and Patient connected to nasal cannula oxygen  Post-op Assessment: Report given to RN  Post vital signs: stable  Last Vitals:  Vitals Value Taken Time  BP    Temp    Pulse    Resp    SpO2      Last Pain:  Vitals:   03/15/20 0723  TempSrc: Tympanic  PainSc: 0-No pain         Complications: No complications documented.

## 2020-03-15 NOTE — Op Note (Signed)
[facility] Gastroenterology Patient Name: Brandi Henry Procedure Date: 03/15/2020 8:01 AM MRN: [id number] Account #: [id number] Date of Birth: 1955-11-23 Admit Type: Outpatient Age: 65 Room: [facility] ENDO ROOM 3 Gender: Female Note Status: Finalized Procedure:             Colonoscopy Indications:           High risk colon cancer surveillance: Personal history                         of colonic polyps Providers:             Andrey Farmer MD, MD Referring MD:          Dion Body (Referring MD) Medicines:             Monitored Anesthesia Care Complications:         No immediate complications. Procedure:             Pre-Anesthesia Assessment:                        - Prior to the procedure, a History and Physical was                         performed, and patient medications and allergies were                         reviewed. The patient is competent. The risks and                         benefits of the procedure and the sedation options and                         risks were discussed with the patient. All questions                         were answered and informed consent was obtained.                         Patient identification and proposed procedure were                         verified by the physician, the nurse, the anesthetist                         and the technician in the endoscopy suite. Mental                         Status Examination: alert and oriented. Airway                         Examination: normal oropharyngeal airway and neck                         mobility. Respiratory Examination: clear to                         auscultation. CV Examination: normal. Prophylactic  Antibiotics: The patient does not require prophylactic                         antibiotics. Prior Anticoagulants: The patient has                         taken no previous anticoagulant or antiplatelet agents                         except for  aspirin. ASA Grade Assessment: III - A                         patient with severe systemic disease. After reviewing                         the risks and benefits, the patient was deemed in                         satisfactory condition to undergo the procedure. The                         anesthesia plan was to use monitored anesthesia care                         (MAC). Immediately prior to administration of                         medications, the patient was re-assessed for adequacy                         to receive sedatives. The heart rate, respiratory                         rate, oxygen saturations, blood pressure, adequacy of                         pulmonary ventilation, and response to care were                         monitored throughout the procedure. The physical                         status of the patient was re-assessed after the                         procedure.                        After obtaining informed consent, the colonoscope was                         passed under direct vision. Throughout the procedure,                         the patient's blood pressure, pulse, and oxygen                         saturations were monitored continuously. The  Colonoscope was introduced through the anus and                         advanced to the the cecum, identified by appendiceal                         orifice and ileocecal valve. The colonoscopy was                         technically difficult and complex due to a redundant                         colon and significant looping. Successful completion                         of the procedure was aided by withdrawing and                         reinserting the scope. The patient tolerated the                         procedure well. The quality of the bowel preparation                         was good. Findings:      The perianal and digital rectal examinations were normal.      Internal  hemorrhoids were found during retroflexion. The hemorrhoids       were Grade I (internal hemorrhoids that do not prolapse).      Multiple small-mouthed diverticula were found in the sigmoid colon.      The exam was otherwise without abnormality on direct and retroflexion       views. Impression:            - Internal hemorrhoids.                        - Diverticulosis in the sigmoid colon.                        - The examination was otherwise normal on direct and                         retroflexion views.                        - No specimens collected. Recommendation:        - Discharge patient to home.                        - Resume previous diet.                        - Continue present medications.                        - Repeat colonoscopy in 5 years for surveillance.                        - Return to referring physician as previously  scheduled. Procedure Code(s):     --- Professional ---                        K5537, Colorectal cancer screening; colonoscopy on                         individual at high risk Diagnosis Code(s):     --- Professional ---                        Z86.010, Personal history of colonic polyps                        K64.0, First degree hemorrhoids                        K57.30, Diverticulosis of large intestine without                         perforation or abscess without bleeding CPT copyright 2019 American Medical Association. All rights reserved. The codes documented in this report are preliminary and upon coder review may  be revised to meet current compliance requirements. Andrey Farmer MD, MD 03/15/2020 8:43:53 AM Number of Addenda: 0 Note Initiated On: 03/15/2020 8:01 AM Scope Withdrawal Time: 0 hours 5 minutes 31 seconds  Total Procedure Duration: 0 hours 34 minutes 37 seconds  Estimated Blood Loss:  Estimated blood loss: none.      [facility]

## 2020-03-15 NOTE — Interval H&P Note (Signed)
History and Physical Interval Note:  03/15/2020 8:01 AM  Brandi Henry  has presented today for surgery, with the diagnosis of ph polylps.  The various methods of treatment have been discussed with the patient and family. After consideration of risks, benefits and other options for treatment, the patient has consented to  Procedure(s): COLONOSCOPY WITH PROPOFOL (N/A) as a surgical intervention.  The patient's history has been reviewed, patient examined, no change in status, stable for surgery.  I have reviewed the patient's chart and labs.  Questions were answered to the patient's satisfaction.     Lesly Rubenstein  Ok to proceed with colonoscopy

## 2020-03-15 NOTE — Anesthesia Preprocedure Evaluation (Signed)
Anesthesia Evaluation  Patient identified by MRN, date of birth, ID band Patient awake    Reviewed: Allergy & Precautions, H&P , NPO status , Patient's Chart, lab work & pertinent test results, reviewed documented beta blocker date and time   History of Anesthesia Complications (+) PONV and history of anesthetic complications  Airway Mallampati: II  TM Distance: >3 FB Neck ROM: full    Dental  (+) Dental Advidsory Given, Teeth Intact, Missing   Pulmonary neg shortness of breath, sleep apnea and Continuous Positive Airway Pressure Ventilation , neg COPD, neg recent URI,    Pulmonary exam normal breath sounds clear to auscultation       Cardiovascular Exercise Tolerance: Good hypertension, (-) angina(-) Past MI and (-) Cardiac Stents Normal cardiovascular exam(-) dysrhythmias (-) Valvular Problems/Murmurs Rhythm:regular Rate:Normal     Neuro/Psych negative neurological ROS  negative psych ROS   GI/Hepatic negative GI ROS, Neg liver ROS,   Endo/Other  neg diabetesHypothyroidism Morbid obesity  Renal/GU 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 BCC with sclerosis. Excised:               06/30/2019, margins free No date: Congenital absence of one kidney     Comment:  no left kidney No date: Dyspnea No date: Hypertension No date: Hypothyroidism No date: Obesity No date: PONV (postoperative nausea and vomiting) No date: Sleep apnea     Comment:  wears CPAP   Reproductive/Obstetrics negative OB ROS                             Anesthesia Physical Anesthesia Plan  ASA: III  Anesthesia Plan: General   Post-op Pain Management:    Induction: Intravenous  PONV Risk  Score and Plan: 4 or greater and TIVA and Propofol infusion  Airway Management Planned: Natural Airway and Nasal Cannula  Additional Equipment:   Intra-op Plan:   Post-operative Plan:   Informed Consent: I have reviewed the patients History and Physical, chart, labs and discussed the procedure including the risks, benefits and alternatives for the proposed anesthesia with the patient or authorized representative who has indicated his/her understanding and acceptance.     Dental Advisory Given  Plan Discussed with: Anesthesiologist, CRNA and Surgeon  Anesthesia Plan Comments:         Anesthesia Quick Evaluation

## 2020-03-15 NOTE — Anesthesia Postprocedure Evaluation (Signed)
Anesthesia Post Note  Patient: Brandi Henry  Procedure(s) Performed: COLONOSCOPY WITH PROPOFOL (N/A )  Patient location during evaluation: Endoscopy Anesthesia Type: General Level of consciousness: awake and alert Pain management: pain level controlled Vital Signs Assessment: post-procedure vital signs reviewed and stable Respiratory status: spontaneous breathing, nonlabored ventilation, respiratory function stable and patient connected to nasal cannula oxygen Cardiovascular status: blood pressure returned to baseline and stable Postop Assessment: no apparent nausea or vomiting Anesthetic complications: no   No complications documented.   Last Vitals:  Vitals:   03/15/20 0858 03/15/20 0908  BP: 122/68 136/73  Pulse: 60 60  Resp: 17 19  Temp:    SpO2: 100% 100%    Last Pain:  Vitals:   03/15/20 0908  TempSrc:   PainSc: 0-No pain                 Martha Clan

## 2020-03-15 NOTE — H&P (Signed)
Outpatient short stay form Pre-procedure 03/15/2020 8:00 AM Brandi Miyamoto MD, MPH  Primary Physician: Dr. Netty Starring  Reason for visit:  Surveillance  History of present illness:   65 y/o lady with history of polyps and family history of colon cancer in her father in his 37's. History of hysterectomy. No blood thinners. No new GI symptoms.    Current Facility-Administered Medications:  .  0.9 %  sodium chloride infusion, , Intravenous, Continuous, Shoji Pertuit, Hilton Cork, MD, Last Rate: 20 mL/hr at 03/15/20 0737, New Bag at 03/15/20 0737  Medications Prior to Admission  Medication Sig Dispense Refill Last Dose  . amLODipine (NORVASC) 10 MG tablet Take 10 mg by mouth at bedtime.   03/14/2020 at Unknown time  . escitalopram (LEXAPRO) 5 MG tablet Take 5 mg by mouth daily.   03/14/2020 at Unknown time  . levothyroxine (SYNTHROID) 125 MCG tablet Take 125 mcg by mouth daily.   03/14/2020 at Unknown time  . lisinopril-hydrochlorothiazide (PRINZIDE,ZESTORETIC) 20-12.5 MG tablet Take 2 tablets by mouth daily.   03/15/2020 at Unknown time  . pravastatin (PRAVACHOL) 20 MG tablet Take 20 mg by mouth at bedtime.   03/14/2020 at Unknown time  . aspirin EC 81 MG tablet Take 81 mg by mouth daily.     Marland Kitchen ipratropium (ATROVENT) 0.03 % nasal spray      . triamcinolone cream (KENALOG) 0.1 % Apply to rash on lower back and right foot 1-2 times a day until improved. Avoid face, groin, underarms. 80 g 0      Allergies  Allergen Reactions  . Sulfa Antibiotics Hives  . Sulfur Hives    Thirty years ago     Past Medical History:  Diagnosis Date  . Basal cell carcinoma 05/06/2019   Left mid forearm. BCC with sclerosis. Excised: 06/30/2019, margins free  . 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 Dyspnea   . Hypertension   . Hypothyroidism   . Obesity   . PONV (postoperative nausea and vomiting)   . Sleep apnea    wears CPAP    Review of systems:  Otherwise negative.    Physical Exam  Gen:  Alert, oriented. Appears stated age.  HEENT: PERRLA. Lungs: No respiratory distress CV: RRR Abd: soft, benign, no masses Ext: No edema    Planned procedures: Proceed with colonoscopy. The patient understands the nature of the planned procedure, indications, risks, alternatives and potential complications including but not limited to bleeding, infection, perforation, damage to internal organs and possible oversedation/side effects from anesthesia. The patient agrees and gives consent to proceed.  Please refer to procedure notes for findings, recommendations and patient disposition/instructions.     Brandi Miyamoto MD, MPH Gastroenterology 03/15/2020  8:00 AM

## 2020-03-16 ENCOUNTER — Encounter: Payer: Self-pay | Admitting: Gastroenterology

## 2020-05-12 ENCOUNTER — Other Ambulatory Visit: Payer: Self-pay | Admitting: Family Medicine

## 2020-05-12 DIAGNOSIS — Z1231 Encounter for screening mammogram for malignant neoplasm of breast: Secondary | ICD-10-CM

## 2020-06-02 ENCOUNTER — Other Ambulatory Visit: Payer: Self-pay

## 2020-06-02 ENCOUNTER — Ambulatory Visit
Admission: RE | Admit: 2020-06-02 | Discharge: 2020-06-02 | Disposition: A | Discharge status: Home / Self Care | Payer: BC Managed Care – PPO | Source: Ambulatory Visit | Attending: Family Medicine | Admitting: Family Medicine

## 2020-06-02 DIAGNOSIS — Z1231 Encounter for screening mammogram for malignant neoplasm of breast: Secondary | ICD-10-CM | POA: Insufficient documentation

## 2020-07-19 ENCOUNTER — Other Ambulatory Visit: Payer: Self-pay

## 2020-07-19 ENCOUNTER — Ambulatory Visit: Payer: BC Managed Care – PPO | Admitting: Dermatology

## 2020-07-19 ENCOUNTER — Encounter: Payer: Self-pay | Admitting: Dermatology

## 2020-07-19 DIAGNOSIS — L821 Other seborrheic keratosis: Secondary | ICD-10-CM | POA: Diagnosis not present

## 2020-07-19 DIAGNOSIS — L578 Other skin changes due to chronic exposure to nonionizing radiation: Secondary | ICD-10-CM | POA: Diagnosis not present

## 2020-07-19 DIAGNOSIS — D1800 Hemangioma unspecified site: Secondary | ICD-10-CM

## 2020-07-19 DIAGNOSIS — L814 Other melanin hyperpigmentation: Secondary | ICD-10-CM

## 2020-07-19 DIAGNOSIS — Z85828 Personal history of other malignant neoplasm of skin: Secondary | ICD-10-CM | POA: Diagnosis not present

## 2020-07-19 DIAGNOSIS — L820 Inflamed seborrheic keratosis: Secondary | ICD-10-CM | POA: Diagnosis not present

## 2020-07-19 DIAGNOSIS — D229 Melanocytic nevi, unspecified: Secondary | ICD-10-CM

## 2020-07-19 NOTE — Progress Notes (Signed)
Follow-Up Visit   Subjective  Brandi Henry is a 65 y.o. female who presents for the following: hx of BCC (L mid forearm), hx of AK (AK vs ISK L hand dorsum 52m f/u), recheck ISK (L flank, 55m f/u), and check new spot (L upper arm).  Raised and gets caught on clothes.  Also has pink scaly spot under right eye that doesn't clear up.   The following portions of the chart were reviewed this encounter and updated as appropriate:       Review of Systems:  No other skin or systemic complaints except as noted in HPI or Assessment and Plan.  Objective  Well appearing patient in no apparent distress; mood and affect are within normal limits.  A focused examination was performed including bil arms, L flank, back, face. Relevant physical exam findings are noted in the Assessment and Plan.  Objective  Left mid forearm: Well healed scar with no evidence of recurrence.   Objective  Left Upper Arm at elbow x 1, L flank x 1, R infraocular x 1 (3): Erythematous keratotic  waxy stuck-on papule L upper arm at elbow, R infraocular Residual keratotic waxy pap L flank   Assessment & Plan    Lentigines - Scattered tan macules - Due to sun exposure - Benign-appering, observe - Recommend daily broad spectrum sunscreen SPF 30+ to sun-exposed areas, reapply every 2 hours as needed. - Call for any changes  Seborrheic Keratoses - Stuck-on, waxy, tan-brown papules and/or plaques  - Benign-appearing - Discussed benign etiology and prognosis. - Observe - Call for any changes  Hemangiomas - Red papules - Discussed benign nature - Observe - Call for any changes  Actinic Damage - chronic, secondary to cumulative UV radiation exposure/sun exposure over time - diffuse scaly erythematous macules with underlying dyspigmentation - Recommend daily broad spectrum sunscreen SPF 30+ to sun-exposed areas, reapply every 2 hours as needed.  - Recommend staying in the shade or wearing long sleeves, sun  glasses (UVA+UVB protection) and wide brim hats (4-inch brim around the entire circumference of the hat). - Call for new or changing lesions.  Melanocytic Nevi - Tan-brown and/or pink-flesh-colored symmetric macules and papules - Benign appearing on exam today - Observation - Call clinic for new or changing moles - Recommend daily use of broad spectrum spf 30+ sunscreen to sun-exposed areas.    History of basal cell carcinoma (BCC) Left mid forearm  Clear. Observe for recurrence. Call clinic for new or changing lesions.  Recommend regular skin exams, daily broad-spectrum spf 30+ sunscreen use, and photoprotection.     Inflamed seborrheic keratosis (3) Left Upper Arm at elbow x 1, L flank x 1, R infraocular x 1  Prior to procedure, discussed risks of blister formation, small wound, skin dyspigmentation, or rare scar following cryotherapy.   Pt will RTC if R infraocular ISK doesn't clear   Destruction of lesion - Left Upper Arm at elbow x 1, L flank x 1, R infraocular x 1  Destruction method: cryotherapy   Informed consent: discussed and consent obtained   Lesion destroyed using liquid nitrogen: Yes   Region frozen until ice ball extended beyond lesion: Yes   Outcome: patient tolerated procedure well with no complications   Post-procedure details: wound care instructions given    Return in about 1 year (around 07/19/2021) for UBSE, Hx of BCC, AK f/u, recheck ISK R infra ocular.  I, Brandi Henry, RMA, am acting as scribe for Brandi Patty, MD .  Documentation: I have reviewed the above documentation for accuracy and completeness, and I agree with the above.  Brandi Patty MD

## 2020-07-19 NOTE — Patient Instructions (Signed)

## 2020-08-06 ENCOUNTER — Other Ambulatory Visit (HOSPITAL_COMMUNITY): Payer: Self-pay | Admitting: General Surgery

## 2020-08-06 ENCOUNTER — Other Ambulatory Visit: Payer: Self-pay | Admitting: General Surgery

## 2020-08-23 ENCOUNTER — Other Ambulatory Visit: Payer: Self-pay

## 2020-08-23 ENCOUNTER — Ambulatory Visit (HOSPITAL_COMMUNITY)
Admission: RE | Admit: 2020-08-23 | Discharge: 2020-08-23 | Disposition: A | Discharge status: Home / Self Care | Payer: BC Managed Care – PPO | Source: Ambulatory Visit | Attending: General Surgery | Admitting: General Surgery

## 2020-09-09 ENCOUNTER — Encounter: Payer: BC Managed Care – PPO | Attending: General Surgery | Admitting: Skilled Nursing Facility1

## 2020-09-09 ENCOUNTER — Encounter: Payer: Self-pay | Admitting: Skilled Nursing Facility1

## 2020-09-09 ENCOUNTER — Other Ambulatory Visit: Payer: Self-pay

## 2020-09-09 DIAGNOSIS — Z6841 Body Mass Index (BMI) 40.0 and over, adult: Secondary | ICD-10-CM | POA: Insufficient documentation

## 2020-09-09 NOTE — Progress Notes (Signed)
Nutrition Assessment for Bariatric Surgery Medical Nutrition Therapy Appt Start Time: 7:55    End Time: 9:00  Patient was seen on 09/09/2020 for Pre-Operative Nutrition Assessment. Letter of approval faxed to [facility] Surgery bariatric surgery program coordinator on 09/09/2020.   Referral stated Supervised Weight Loss (SWL) visits needed: 0  Pt completed visits.   Pt has cleared nutrition requirements.    Planned surgery: sleeve gastrectomy  Pt expectation of surgery: to lose weight Pt expectation of dietitian: none identified     NUTRITION ASSESSMENT   Anthropometrics  Start weight at [facility]: 324.1 lbs (date: 09/09/2020)  Height: 62 in BMI: 59.28 kg/m2     Clinical  Medical hx: prediebetes, congenital one kidney  Medications: xEO mega omega complex, alpha Cr+ cellular vitality complex, micro plex VMz food nutrient complex, DDR prime cellular complex, deep blue polyphenol complex, mitro2 max energy and stamina complex, copaiba, terrazyme digestive enzyme complex, slim and sassy metabolic blend  Labs: Q6V 6.0 Notable signs/symptoms: dry skin, itchy ears, joint pain, knee pain  Any previous deficiencies? No  Micronutrient Nutrition Focused Physical Exam: Hair: No issues observed Eyes: No issues observed Mouth: No issues observed Neck: No issues observed Nails: No issues observed Skin: No issues observed  Lifestyle & Dietary Hx  Pt states her goal is to walk 3500 steps.   Pt states she has made a conscious effort to not go back for seconds.  Pt states she has made a conscious effort to not eat past fullness since March.   24-Hr Dietary Recall First Meal: 2 45 calorie cheese toast and ham Snack:  Second Meal: skipped Snack: cheese or chips Third Meal: chicken + broccoli or green beans sometimes baked potatoes and mushrooms  Snack: ice cream  Beverages: coffee + flavored creamer, water, sometimes wine   Estimated Energy Needs Calories: 1500   NUTRITION  DIAGNOSIS  Overweight/obesity (Ke[place]-3.3) related to past poor dietary habits and physical inactivity as evidenced by patient w/ planned sleeve gastrectomy surgery following dietary guidelines for continued weight loss.    NUTRITION INTERVENTION  Nutrition counseling (C-1) and education (E-2) to facilitate bariatric surgery goals.  Educated pt on micronutrient deficiencies post surgery and strategies to mitigate that risk   Pre-Op Goals Reviewed with the Patient Track food and beverage intake (pen and paper, MyFitness Pal, Baritastic app, etc.) Make healthy food choices while monitoring portion sizes Consume 3 meals per day or try to eat every 3-5 hours Avoid concentrated sugars and fried foods Keep sugar & fat in the single digits per serving on food labels Practice CHEWING your food (aim for applesauce consistency) Practice not drinking 15 minutes before, during, and 30 minutes after each meal and snack Avoid all carbonated beverages (ex: soda, sparkling beverages)  Limit caffeinated beverages (ex: coffee, tea, energy drinks) Avoid all sugar-sweetened beverages (ex: regular soda, sports drinks)  Avoid alcohol  Aim for 64-100 ounces of FLUID daily (with at least half of fluid intake being plain water)  Aim for at least 60-80 grams of PROTEIN daily Look for a liquid protein source that contains ?15 g protein and ?5 g carbohydrate (ex: shakes, drinks, shots) Make a list of non-food related activities Physical activity is an important part of a healthy lifestyle so keep it moving! The goal is to reach 150 minutes of exercise per week, including cardiovascular and weight baring activity.  *Goals that are bolded indicate the pt would like to start working towards these  Handouts Provided Include  Bariatric Surgery handouts (Nutrition  Visits, Pre-Op Goals, Protein Shakes, Vitamins & Minerals)  Learning Style & Readiness for Change Teaching method utilized: Visual & Auditory  Demonstrated  degree of understanding via: Teach Back  Readiness Level: Action Barriers to learning/adherence to lifestyle change: none identified     MONITORING & EVALUATION Dietary intake, weekly physical activity, body weight, and pre-op goals reached at next nutrition visit.    Next Steps  Patient is to follow up at [facility] for Pre-Op Class >2 weeks before surgery for further nutrition education.   Pt has completed visits. No further supervised visits required/recomended

## 2020-10-04 NOTE — Progress Notes (Signed)
Sent message, via epic in basket, requesting orders in epic from surgeon.  

## 2020-10-11 ENCOUNTER — Other Ambulatory Visit: Payer: Self-pay

## 2020-10-11 ENCOUNTER — Encounter: Payer: BC Managed Care – PPO | Attending: General Surgery | Admitting: Skilled Nursing Facility1

## 2020-10-11 DIAGNOSIS — Z6841 Body Mass Index (BMI) 40.0 and over, adult: Secondary | ICD-10-CM | POA: Insufficient documentation

## 2020-10-11 NOTE — Progress Notes (Signed)
Pre-Operative Nutrition Class:    Patient was seen on 10/11/2020 for Pre-Operative Bariatric Surgery Education at [facility]    Surgery date: 10/25/2020 Surgery type: sleeve Start weight at [facility]: 324 Weight today: 320  Samples given per MNT protocol. Patient educated on appropriate usage: Protien 20 shake Lot: [id number] Exp: 07/16/21   Bariatric Advantage Calcium Lot # [id number] Exp: 02/23     procare Vitamins Calcium Lot # [id number] Exp: 01/23   The following the learning objectives were met by the patient during this course: Identify Pre-Op Dietary Goals and will begin 2 weeks pre-operatively Identify appropriate sources of fluids and proteins  State protein recommendations and appropriate sources pre and post-operatively Identify Post-Operative Dietary Goals and will follow for 2 weeks post-operatively Identify appropriate multivitamin and calcium sources Describe the need for physical activity post-operatively and will follow MD recommendations State when to call healthcare provider regarding medication questions or post-operative complications When having a diagnosis of diabetes understanding hypoglycemia symptoms and the inclusion of 1 complex carbohydrate per meal  Handouts given during class include: Pre-Op Bariatric Surgery Diet Handout Protein Shake Handout Post-Op Bariatric Surgery Nutrition Handout BELT Program Information Flyer Support Group Information Flyer [facility] Bariatric Supplements Price List  Follow-Up Plan: Patient will follow-up at [facility] 2 weeks post operatively for diet advancement per MD.

## 2020-10-14 NOTE — Progress Notes (Addendum)
COVID test: 10/21/20  COVID Vaccine Completed: yes x3 Date COVID Vaccine completed: 05/26/20, 06/24/19 Has received booster: 01/14/20 COVID vaccine manufacturer: Moderna     Date of COVID positive in last 90 days: No  PCP - Dion Body, MD Cardiologist - N/a  Chest x-ray - 08/23/20 Epic EKG - 08/23/20 Epic Stress Test - N/a ECHO -  N/a Cardiac Cath - N/a Pacemaker/ICD device last checked: N/a Spinal Cord Stimulator: N/a  Sleep Study - yes positive CPAP - yes every night  Fasting Blood Sugar - borderline Checks Blood Sugar _____ times a day  Blood Thinner Instructions: Aspirin Instructions: ASA 81, no instructions given. Instructed to ask PCP. Last Dose:  Activity level: Can perform activities of daily living without stopping and without symptoms of chest pain or shortness of breath. Sob with stairs, reports this is not new.       Anesthesia review: chronic SOB, HTN, OSA  Patient denies shortness of breath, fever, cough and chest pain at PAT appointment   Patient verbalized understanding of instructions that were given to them at the PAT appointment. Patient was also instructed that they will need to review over the PAT instructions again at home before surgery.

## 2020-10-14 NOTE — Patient Instructions (Addendum)
DUE TO COVID-19 ONLY ONE VISITOR IS ALLOWED TO COME WITH YOU AND STAY IN THE WAITING ROOM ONLY DURING PRE OP AND PROCEDURE.   **NO VISITORS ARE ALLOWED IN THE SHORT STAY AREA OR RECOVERY ROOM!!**  IF YOU WILL BE ADMITTED INTO THE HOSPITAL YOU ARE ALLOWED ONLY TWO SUPPORT PEOPLE DURING VISITATION HOURS ONLY (10AM -8PM)   The support person(s) may change daily. The support person(s) must pass our screening, gel in and out, and wear a mask at all times, including in the patient's room. Patients must also wear a mask when staff or their support person are in the room.  No visitors under the age of 32. Any visitor under the age of 76 must be accompanied by an adult.    COVID SWAB TESTING MUST BE COMPLETED ON:  10/21/20 **MUST PRESENT COMPLETED FORM AT TESTING SITE**    [facility] [place] (backside of the building). Open 8am-3pm. No appointment needed. You are not required to quarantine, however you are required to wear a well-fitted mask when you are out and around people not in your household.  Hand Hygiene often Do NOT share personal items Notify your provider if you are in close contact with someone who has COVID or you develop fever 100.4 or greater, new onset of sneezing, cough, sore throat, shortness of breath or body aches.       Your procedure is scheduled on: 10/25/20   Report to [facility] Main  Entrance    Report to admitting at 11:45 AM   Call this number if you have problems the morning of surgery [phone]   No solid food after 6pm night before surgery.   PAIN IS EXPECTED AFTER SURGERY AND WILL NOT BE COMPLETELY ELIMINATED. AMBULATION AND TYLENOL WILL HELP REDUCE INCISIONAL AND GAS PAIN. MOVEMENT IS KEY!  YOU ARE EXPECTED TO BE OUT OF BED WITHIN 4 HOURS OF ADMISSION TO YOUR PATIENT ROOM.  SITTING IN THE RECLINER THROUGHOUT THE DAY IS IMPORTANT FOR DRINKING FLUIDS AND MOVING GAS THROUGHOUT THE GI TRACT.  COMPRESSION STOCKINGS SHOULD BE WORN  Belt UNLESS YOU ARE WALKING.   INCENTIVE SPIROMETER SHOULD BE USED EVERY HOUR WHILE AWAKE TO DECREASE POST-OPERATIVE COMPLICATIONS SUCH AS PNEUMONIA.  WHEN DISCHARGED HOME, IT IS IMPORTANT TO CONTINUE TO WALK EVERY HOUR AND USE THE INCENTIVE SPIROMETER EVERY HOUR.   CLEAR LIQUID DIET  Foods Allowed                                                                     Foods Excluded  Water, Black Coffee and tea, regular and decaf                liquids that you cannot  Plain Jell-O in any flavor  (No red)                                     see through such as: Fruit ices (not with fruit pulp)  milk, soups, orange juice              Iced Popsicles (No red)                                                 All solid food                                   Apple juices Sports drinks like Gatorade (No red) Lightly seasoned clear broth or consume(fat free) Sugar, honey syrup     Oral Hygiene is also important to reduce your risk of infection.                                    Remember - BRUSH YOUR TEETH THE MORNING OF SURGERY WITH YOUR REGULAR TOOTHPASTE   Take these medicines the morning of surgery with A SIP OF WATER: Lexapro, Levothyroxine.                               You may not have any metal on your body including hair pins, jewelry, and body piercing             Do not wear make-up, lotions, powders, perfumes, or deodorant  Do not wear nail polish including gel and S&S, artificial/acrylic nails, or any other type of covering on natural nails including finger and toenails. If you have artificial nails, gel coating, etc. that needs to be removed by a nail salon please have this removed prior to surgery or surgery may need to be canceled/ delayed if the surgeon/ anesthesia feels like they are unable to be safely monitored.   Do not shave  48 hours prior to surgery.        Do not bring valuables to the hospital. [place].   Bring small overnight bag day of surgery.    Bring CPAP mask and tubing day of surgery.    Ask your doctor about instructions regarding Aspirin before surgery.  Please read over the following fact sheets you were given: IF YOU HAVE QUESTIONS ABOUT YOUR PRE OP INSTRUCTIONS PLEASE CALL [facility] - Preparing for Surgery Before surgery, you can play an important role.  Because skin is not sterile, your skin needs to be as free of germs as possible.  You can reduce the number of germs on your skin by washing with CHG (chlorahexidine gluconate) soap before surgery.  CHG is an antiseptic cleaner which kills germs and bonds with the skin to continue killing germs even after washing. Please DO NOT use if you have an allergy to CHG or antibacterial soaps.  If your skin becomes reddened/irritated stop using the CHG and inform your nurse when you arrive at Short Stay. Do not shave (including legs and underarms) for at least 48 hours prior to the first CHG shower.  You may shave your face/neck.  Please follow these instructions carefully:  1.  Shower with CHG Soap the night before surgery and the  morning of surgery.  2.  If you choose to wash your hair, wash your hair first as usual with your normal  shampoo.  3.  After you shampoo, rinse your hair and body thoroughly to remove the shampoo.                             4.  Use CHG as you would any other liquid soap.  You can apply chg directly to the skin and wash.  Gently with a scrungie or clean washcloth.  5.  Apply the CHG Soap to your body ONLY FROM THE NECK DOWN.   Do   not use on face/ open                           Wound or open sores. Avoid contact with eyes, ears mouth and   genitals (private parts).                       Wash face,  Genitals (private parts) with your normal soap.             6.  Wash thoroughly, paying special attention to the area where your     surgery  will be performed.  7.  Thoroughly rinse your body with warm water from the neck down.  8.  DO NOT shower/wash with your normal soap after using and rinsing off the CHG Soap.                9.  Pat yourself dry with a clean towel.            10.  Wear clean pajamas.            11.  Place clean sheets on your bed the night of your first shower and do not  sleep with pets. Day of Surgery : Do not apply any lotions/deodorants the morning of surgery.  Please wear clean clothes to the hospital/surgery center.  FAILURE TO FOLLOW THESE INSTRUCTIONS MAY RESULT IN THE CANCELLATION OF YOUR SURGERY  PATIENT SIGNATURE_________________________________  NURSE SIGNATURE__________________________________  ________________________________________________________________________

## 2020-10-15 ENCOUNTER — Other Ambulatory Visit (HOSPITAL_COMMUNITY): Payer: Self-pay

## 2020-10-19 ENCOUNTER — Encounter (HOSPITAL_COMMUNITY): Payer: Self-pay

## 2020-10-19 ENCOUNTER — Encounter (HOSPITAL_COMMUNITY)
Admission: RE | Admit: 2020-10-19 | Discharge: 2020-10-19 | Disposition: A | Discharge status: Home / Self Care | Payer: BC Managed Care – PPO | Source: Ambulatory Visit | Attending: General Surgery | Admitting: General Surgery

## 2020-10-19 ENCOUNTER — Other Ambulatory Visit: Payer: Self-pay

## 2020-10-19 DIAGNOSIS — Z01812 Encounter for preprocedural laboratory examination: Secondary | ICD-10-CM | POA: Diagnosis not present

## 2020-10-19 DIAGNOSIS — R7303 Prediabetes: Secondary | ICD-10-CM | POA: Insufficient documentation

## 2020-10-19 HISTORY — DX: Unspecified osteoarthritis, unspecified site: M19.90

## 2020-10-19 LAB — BASIC METABOLIC PANEL
Anion gap: 9 (ref 5–15)
BUN: 27 mg/dL — ABNORMAL HIGH (ref 8–23)
CO2: 26 mmol/L (ref 22–32)
Calcium: 9.7 mg/dL (ref 8.9–10.3)
Chloride: 106 mmol/L (ref 98–111)
Creatinine, Ser: 0.65 mg/dL (ref 0.44–1.00)
GFR, Estimated: >60 mL/min (ref >60)
Glucose, Bld: 93 mg/dL (ref 70–99)
Potassium: 4.5 mmol/L (ref 3.5–5.1)
Sodium: 141 mmol/L (ref 135–145)

## 2020-10-19 LAB — CBC
HCT: 38.9 % (ref 36.0–46.0)
Hemoglobin: 12.4 g/dL (ref 12.0–15.0)
MCH: 30.2 pg (ref 26.0–34.0)
MCHC: 31.9 g/dL (ref 30.0–36.0)
MCV: 94.6 fL (ref 80.0–100.0)
Platelets: 282 10*3/uL (ref 150–400)
RBC: 4.11 MIL/uL (ref 3.87–5.11)
RDW: 14 % (ref 11.5–15.5)
WBC: 9.3 10*3/uL (ref 4.0–10.5)
nRBC: 0 % (ref 0.0–0.2)

## 2020-10-19 LAB — HEMOGLOBIN A1C
Hgb A1c MFr Bld: 5.9 % — ABNORMAL HIGH (ref 4.8–5.6)
Mean Plasma Glucose: 122.63 mg/dL

## 2020-10-21 ENCOUNTER — Other Ambulatory Visit: Payer: Self-pay | Admitting: General Surgery

## 2020-10-22 LAB — SARS CORONAVIRUS 2 (TAT 6-24 HRS): SARS Coronavirus 2: NEGATIVE

## 2020-10-22 NOTE — H&P (Deleted)
PROVIDER:  Tanvi Gatling Leanne Chang, MD   MRN: [id number] DOB: 02/18/1959 DATE OF ENCOUNTER: 10/22/2020 Subjective    Chief Complaint: Weight Loss       History of Present Illness: Brandi Henry is a 65 y.o. female who is seen today for long-term follow-up regarding her obesity, hypertension, obstructive sleep apnea, prediabetes, right hip osteoarthritis.  She has completed the bariatric surgery evaluation process.  I initially met her in February 2022.Marland Kitchen  Her initial visit weight was 215 pounds.  Her weight today is 203 pounds.   She received psychological clearance and April.  She also received cardiology clearance in March 2022.  Upper GI, chest x-ray and mammogram were within normal limits.  She did have a CT angio of her chest in February 2022 which was unremarkable but it showed a tiny hiatal hernia.  She states that she was having some anxiety.  Otherwise she denies any medical changes.  No chest pain, chest pressure, shortness of breath, no abdominal pain.  No heartburn.       Review of Systems: A complete review of systems was obtained from the patient.  I have reviewed this information and discussed as appropriate with the patient.  See HPI as well for other ROS.   ROS      Medical History: Past Medical History      Past Medical History:  Diagnosis Date   Allergic state     Glaucoma (increased eye pressure)     Hypertension     Sleep apnea     Thyroid disease             Patient Active Problem List  Diagnosis   HTN (hypertension), benign   Hypothyroidism   DJD (degenerative joint disease)   Open angle primary glaucoma   OSA (obstructive sleep apnea)   Prediabetes   Thalassemia trait, alpha      Past Surgical History       Past Surgical History:  Procedure Laterality Date   BUNION CORRECTION       CHOLECYSTECTOMY       HYSTERECTOMY       INCISION TENDON SHEATH FOR TRIGGER FINGER            Allergies       Allergies  Allergen Reactions    Diphenhydramine Hcl Hallucination and Itching      hallucination    Hydrocodone Nausea   Hydromorphone Hcl Itching   Oxycodone Nausea And Vomiting   Penicillin Rash   Azithromycin Itching and Rash   Cephalexin Rash   Penicillin G Rash      Did it involve swelling of the face/tongue/throat, SOB, or low BP? No Did it involve sudden or severe rash/hives, skin peeling, or any reaction on the inside of your mouth or nose? No Did you need to seek medical attention at a hospital or doctor's office? No When did it last happen?      childhood If all above answers are "NO", may proceed with cephalosporin use.   Zolpidem Itching and Rash              Current Outpatient Medications on File Prior to Visit  Medication Sig Dispense Refill   aspirin 81 MG EC tablet Take 81 mg by mouth once daily.       CALCIUM CARBONATE/VITAMIN D3 (CALCIUM 500 + D ORAL) Take by mouth.       ferrous sulfate 325 (65 FE) MG tablet Take 325 mg by mouth daily with breakfast.  levothyroxine (SYNTHROID) 50 MCG tablet Take by mouth       levothyroxine (SYNTHROID, LEVOTHROID) 50 MCG tablet Take 50 mcg by mouth once daily. Take on an empty stomach with a glass of water at least 30-60 minutes before breakfast.       multivitamin tablet Take 1 tablet by mouth once daily.       naltrexone-bupropion 8-90 mg TbER Take by mouth 2 (two) times daily.       omega-3 fatty acids-fish oil 300-1,000 mg capsule Take 1 capsule by mouth.       potassium chloride (KLOR-CON) 10 MEQ ER tablet Take 10 mEq by mouth once daily.        No current facility-administered medications on file prior to visit.      Family History       Family History  Problem Relation Age of Onset   Obesity Mother     High blood pressure (Hypertension) Mother     Obesity Sister     High blood pressure (Hypertension) Sister     High blood pressure (Hypertension) Brother     Obesity Brother     Diabetes Brother     Diabetes Maternal Uncle           Social History       Tobacco Use  Smoking Status Never Smoker  Smokeless Tobacco Never Used      Social History  Social History        Socioeconomic History   Marital status: Married  Tobacco Use   Smoking status: Never Smoker   Smokeless tobacco: Never Used  Substance and Sexual Activity   Alcohol use: No   Drug use: Never        Objective:         Vitals:    10/20/20 1118  BP: (!) 146/80  Pulse: 86  Temp: 36.6 C (97.9 F)  SpO2: 98%  Weight: 92.1 kg (203 lb)  Height: 152.4 cm (5')    Body mass index is 39.65 kg/m.   Gen: alert, NAD, non-toxic appearing, obese Pupils: equal, no scleral icterus Pulm: Lungs clear to auscultation, symmetric chest rise CV: regular rate and rhythm Abd: soft, nontender, nondistended.. No cellulitis. No incisional hernia Ext: no edema,  Skin: no rash, no jaundice     Labs, Imaging and Diagnostic Testing:   Discussed above     Assessment and Plan:  Diagnoses and all orders for this visit:   HTN (hypertension), benign   Hypothyroidism, unspecified type   Primary open angle glaucoma of left eye, unspecified glaucoma stage   OSA (obstructive sleep apnea)   Prediabetes   Thalassemia trait, alpha   Primary osteoarthritis of right hip   Class 2 severe obesity with serious comorbidity and body mass index (BMI) of 39.0 to 39.9 in adult, unspecified obesity type (CMS-HCC)       She is completed the bariatric surgery evaluation process.  We reviewed her work-up.  We discussed the findings of a small hiatal hernia.  I discussed that we would test for 1 intraoperatively.  If found to have 1 we would repair it.  I discussed what that would involve.  She has attended her preoperative education class.  She signed the surgical consent form.  Plan is for laparoscopic sleeve gastrectomy with possible hiatal hernia repair.   We rediscussed the typical hospitalization as well as the typical recovery.  We discussed the typical  issues that we see after surgery.  All of her questions  were asked and answered   This patient encounter took 22 minutes today to perform the following: take history, perform exam, review outside records, interpret imaging, counsel the patient on their diagnosis    No follow-ups on file.   Leighton Ruff. Akiva Josey MD FACS General, Minimally Invasive, & Bariatric Surgery

## 2020-10-25 ENCOUNTER — Encounter (HOSPITAL_COMMUNITY)
Admission: RE | Disposition: A | Discharge status: Home / Self Care | Payer: Self-pay | Source: Home / Self Care | Attending: General Surgery

## 2020-10-25 ENCOUNTER — Inpatient Hospital Stay (HOSPITAL_COMMUNITY): Payer: BC Managed Care – PPO | Admitting: Certified Registered"

## 2020-10-25 ENCOUNTER — Encounter (HOSPITAL_COMMUNITY): Payer: Self-pay | Admitting: General Surgery

## 2020-10-25 ENCOUNTER — Inpatient Hospital Stay (HOSPITAL_COMMUNITY): Payer: BC Managed Care – PPO | Admitting: Emergency Medicine

## 2020-10-25 ENCOUNTER — Inpatient Hospital Stay (HOSPITAL_COMMUNITY)
Admission: RE | Admit: 2020-10-25 | Discharge: 2020-10-27 | DRG: 621 | Disposition: A | Discharge status: Home / Self Care | Payer: BC Managed Care – PPO | Attending: General Surgery | Admitting: General Surgery

## 2020-10-25 ENCOUNTER — Other Ambulatory Visit: Payer: Self-pay

## 2020-10-25 DIAGNOSIS — Z882 Allergy status to sulfonamides status: Secondary | ICD-10-CM | POA: Diagnosis not present

## 2020-10-25 DIAGNOSIS — E782 Mixed hyperlipidemia: Secondary | ICD-10-CM | POA: Diagnosis present

## 2020-10-25 DIAGNOSIS — Z79890 Hormone replacement therapy (postmenopausal): Secondary | ICD-10-CM

## 2020-10-25 DIAGNOSIS — Z79899 Other long term (current) drug therapy: Secondary | ICD-10-CM

## 2020-10-25 DIAGNOSIS — K224 Dyskinesia of esophagus: Secondary | ICD-10-CM | POA: Diagnosis present

## 2020-10-25 DIAGNOSIS — Z6841 Body Mass Index (BMI) 40.0 and over, adult: Secondary | ICD-10-CM

## 2020-10-25 DIAGNOSIS — Z801 Family history of malignant neoplasm of trachea, bronchus and lung: Secondary | ICD-10-CM | POA: Diagnosis not present

## 2020-10-25 DIAGNOSIS — G4733 Obstructive sleep apnea (adult) (pediatric): Secondary | ICD-10-CM | POA: Diagnosis present

## 2020-10-25 DIAGNOSIS — Z7982 Long term (current) use of aspirin: Secondary | ICD-10-CM

## 2020-10-25 DIAGNOSIS — E039 Hypothyroidism, unspecified: Secondary | ICD-10-CM | POA: Diagnosis present

## 2020-10-25 DIAGNOSIS — Z800 Family history of malignant neoplasm of digestive organs: Secondary | ICD-10-CM

## 2020-10-25 DIAGNOSIS — Z9884 Bariatric surgery status: Secondary | ICD-10-CM

## 2020-10-25 DIAGNOSIS — Z90710 Acquired absence of both cervix and uterus: Secondary | ICD-10-CM | POA: Diagnosis not present

## 2020-10-25 DIAGNOSIS — E079 Disorder of thyroid, unspecified: Secondary | ICD-10-CM | POA: Diagnosis present

## 2020-10-25 DIAGNOSIS — Z85828 Personal history of other malignant neoplasm of skin: Secondary | ICD-10-CM | POA: Diagnosis not present

## 2020-10-25 DIAGNOSIS — Z8249 Family history of ischemic heart disease and other diseases of the circulatory system: Secondary | ICD-10-CM

## 2020-10-25 DIAGNOSIS — I10 Essential (primary) hypertension: Secondary | ICD-10-CM | POA: Diagnosis present

## 2020-10-25 DIAGNOSIS — R7303 Prediabetes: Secondary | ICD-10-CM | POA: Diagnosis present

## 2020-10-25 DIAGNOSIS — L249 Irritant contact dermatitis, unspecified cause: Secondary | ICD-10-CM

## 2020-10-25 HISTORY — PX: UPPER GI ENDOSCOPY: SHX6162

## 2020-10-25 LAB — TYPE AND SCREEN
ABO/RH(D): O NEG
Antibody Screen: NEGATIVE

## 2020-10-25 LAB — HEMOGLOBIN AND HEMATOCRIT, BLOOD
HCT: 38.5 % (ref 36.0–46.0)
Hemoglobin: 12.2 g/dL (ref 12.0–15.0)

## 2020-10-25 LAB — ABO/RH: ABO/RH(D): O NEG

## 2020-10-25 SURGERY — GASTRECTOMY, SLEEVE, ROBOT-ASSISTED
Anesthesia: General | Site: Abdomen

## 2020-10-25 MED ORDER — ACETAMINOPHEN 500 MG PO TABS: 1000.0000 mg | ORAL_TABLET | ORAL | Status: AC

## 2020-10-25 MED ORDER — EPHEDRINE 5 MG/ML INJ
INTRAVENOUS | Status: AC
  Filled 2020-10-25: qty 5

## 2020-10-25 MED ORDER — MIDAZOLAM HCL 2 MG/2ML IJ SOLN
INTRAMUSCULAR | Status: DC | PRN
Start: 2020-10-25 — End: 2020-10-25
  Administered 2020-10-25: 2 mg via INTRAVENOUS

## 2020-10-25 MED ORDER — LACTATED RINGERS IR SOLN
Status: DC | PRN
  Administered 2020-10-25: 1000 mL

## 2020-10-25 MED ORDER — ONDANSETRON HCL 4 MG/2ML IJ SOLN
4.0000 mg | Freq: Four times a day (QID) | INTRAMUSCULAR | Status: DC | PRN
  Administered 2020-10-25 – 2020-10-26 (×2): 4 mg via INTRAVENOUS
  Filled 2020-10-25 (×2): qty 2

## 2020-10-25 MED ORDER — BUPIVACAINE LIPOSOME 1.3 % IJ SUSP
20.0000 mL | Freq: Once | INTRAMUSCULAR | Status: DC
  Filled 2020-10-25: qty 20

## 2020-10-25 MED ORDER — ORAL CARE MOUTH RINSE: 15.0000 mL | Freq: Once | OROMUCOSAL | Status: AC

## 2020-10-25 MED ORDER — ENOXAPARIN (LOVENOX) PATIENT EDUCATION KIT
PACK | Freq: Once | Status: AC
  Filled 2020-10-25: qty 1

## 2020-10-25 MED ORDER — GABAPENTIN 100 MG PO CAPS
200.0000 mg | ORAL_CAPSULE | Freq: Two times a day (BID) | ORAL | Status: DC
  Administered 2020-10-25 – 2020-10-27 (×4): 200 mg via ORAL
  Filled 2020-10-25 (×4): qty 2

## 2020-10-25 MED ORDER — ESCITALOPRAM OXALATE 10 MG PO TABS
10.0000 mg | ORAL_TABLET | Freq: Every day | ORAL | Status: DC
  Administered 2020-10-26 – 2020-10-27 (×2): 10 mg via ORAL
  Filled 2020-10-25 (×2): qty 1

## 2020-10-25 MED ORDER — KCL IN DEXTROSE-NACL 20-5-0.45 MEQ/L-%-% IV SOLN
INTRAVENOUS | Status: DC
  Filled 2020-10-25 (×6): qty 1000

## 2020-10-25 MED ORDER — BUPIVACAINE LIPOSOME 1.3 % IJ SUSP
INTRAMUSCULAR | Status: DC | PRN
  Administered 2020-10-25: 20 mL

## 2020-10-25 MED ORDER — MORPHINE SULFATE (PF) 2 MG/ML IV SOLN: 1.0000 mg | INTRAVENOUS | Status: DC | PRN

## 2020-10-25 MED ORDER — APREPITANT 40 MG PO CAPS: 40.0000 mg | ORAL_CAPSULE | ORAL | Status: AC

## 2020-10-25 MED ORDER — SCOPOLAMINE 1 MG/3DAYS TD PT72: 1.0000 | MEDICATED_PATCH | TRANSDERMAL | Status: DC

## 2020-10-25 MED ORDER — SODIUM CHLORIDE 0.9 % IV SOLN
INTRAVENOUS | Status: AC
  Filled 2020-10-25: qty 2

## 2020-10-25 MED ORDER — HYPROMELLOSE (GONIOSCOPIC) 2.5 % OP SOLN: 1.0000 [drp] | Freq: Three times a day (TID) | OPHTHALMIC | Status: DC | PRN

## 2020-10-25 MED ORDER — ROCURONIUM BROMIDE 10 MG/ML (PF) SYRINGE
PREFILLED_SYRINGE | INTRAVENOUS | Status: AC
  Filled 2020-10-25: qty 10

## 2020-10-25 MED ORDER — HEPARIN SODIUM (PORCINE) 5000 UNIT/ML IJ SOLN: 5000.0000 [IU] | INTRAMUSCULAR | Status: AC

## 2020-10-25 MED ORDER — FENTANYL CITRATE (PF) 100 MCG/2ML IJ SOLN
25.0000 ug | INTRAMUSCULAR | Status: DC | PRN
  Administered 2020-10-25 (×2): 50 ug via INTRAVENOUS

## 2020-10-25 MED ORDER — LIDOCAINE 2% (20 MG/ML) 5 ML SYRINGE
INTRAMUSCULAR | Status: DC | PRN
  Administered 2020-10-25: 100 mg via INTRAVENOUS

## 2020-10-25 MED ORDER — HYDRALAZINE HCL 20 MG/ML IJ SOLN: 10.0000 mg | INTRAMUSCULAR | Status: DC | PRN

## 2020-10-25 MED ORDER — FENTANYL CITRATE (PF) 250 MCG/5ML IJ SOLN
INTRAMUSCULAR | Status: DC | PRN
  Administered 2020-10-25 (×2): 50 ug via INTRAVENOUS
  Administered 2020-10-25: 100 ug via INTRAVENOUS

## 2020-10-25 MED ORDER — ACETAMINOPHEN 160 MG/5ML PO SOLN: 1000.0000 mg | Freq: Three times a day (TID) | ORAL | Status: DC

## 2020-10-25 MED ORDER — DEXAMETHASONE SODIUM PHOSPHATE 4 MG/ML IJ SOLN: 4.0000 mg | INTRAMUSCULAR | Status: DC

## 2020-10-25 MED ORDER — 0.9 % SODIUM CHLORIDE (POUR BTL) OPTIME
TOPICAL | Status: DC | PRN
  Administered 2020-10-25: 1000 mL

## 2020-10-25 MED ORDER — GABAPENTIN 300 MG PO CAPS
ORAL_CAPSULE | ORAL | Status: AC
  Administered 2020-10-25: 300 mg via ORAL
  Filled 2020-10-25: qty 1

## 2020-10-25 MED ORDER — AMISULPRIDE (ANTIEMETIC) 5 MG/2ML IV SOLN: 10.0000 mg | Freq: Once | INTRAVENOUS | Status: DC | PRN

## 2020-10-25 MED ORDER — FENTANYL CITRATE (PF) 100 MCG/2ML IJ SOLN
INTRAMUSCULAR | Status: AC
  Filled 2020-10-25: qty 2

## 2020-10-25 MED ORDER — PROPOFOL 10 MG/ML IV BOLUS
INTRAVENOUS | Status: AC
  Filled 2020-10-25: qty 20

## 2020-10-25 MED ORDER — PROMETHAZINE HCL 25 MG/ML IJ SOLN
6.2500 mg | INTRAMUSCULAR | Status: DC | PRN
  Administered 2020-10-25: 12.5 mg via INTRAVENOUS

## 2020-10-25 MED ORDER — LEVOTHYROXINE SODIUM 125 MCG PO TABS
125.0000 ug | ORAL_TABLET | Freq: Every day | ORAL | Status: DC
  Administered 2020-10-26 – 2020-10-27 (×2): 125 ug via ORAL
  Filled 2020-10-25 (×2): qty 1

## 2020-10-25 MED ORDER — PANTOPRAZOLE SODIUM 40 MG IV SOLR
40.0000 mg | Freq: Every day | INTRAVENOUS | Status: DC
  Administered 2020-10-25 – 2020-10-26 (×2): 40 mg via INTRAVENOUS
  Filled 2020-10-25 (×2): qty 40

## 2020-10-25 MED ORDER — EPHEDRINE SULFATE-NACL 50-0.9 MG/10ML-% IV SOSY
PREFILLED_SYRINGE | INTRAVENOUS | Status: DC | PRN
  Administered 2020-10-25: 5 mg via INTRAVENOUS

## 2020-10-25 MED ORDER — MIDAZOLAM HCL 2 MG/2ML IJ SOLN
INTRAMUSCULAR | Status: AC
  Filled 2020-10-25: qty 2

## 2020-10-25 MED ORDER — ENSURE MAX PROTEIN PO LIQD
2.0000 [oz_av] | ORAL | Status: DC
  Administered 2020-10-26 – 2020-10-27 (×8): 2 [oz_av] via ORAL

## 2020-10-25 MED ORDER — PROPOFOL 10 MG/ML IV BOLUS
INTRAVENOUS | Status: DC | PRN
  Administered 2020-10-25: 200 mg via INTRAVENOUS

## 2020-10-25 MED ORDER — PHENYLEPHRINE HCL-NACL 20-0.9 MG/250ML-% IV SOLN
INTRAVENOUS | Status: AC
  Filled 2020-10-25: qty 250

## 2020-10-25 MED ORDER — CHLORHEXIDINE GLUCONATE 4 % EX LIQD: 60.0000 mL | Freq: Once | CUTANEOUS | Status: DC

## 2020-10-25 MED ORDER — POLYVINYL ALCOHOL 1.4 % OP SOLN
1.0000 [drp] | Freq: Three times a day (TID) | OPHTHALMIC | Status: DC | PRN
  Filled 2020-10-25: qty 15

## 2020-10-25 MED ORDER — HYDROCHLOROTHIAZIDE 25 MG PO TABS
25.0000 mg | ORAL_TABLET | Freq: Every day | ORAL | Status: DC
  Filled 2020-10-25: qty 1

## 2020-10-25 MED ORDER — LISINOPRIL 20 MG PO TABS
40.0000 mg | ORAL_TABLET | Freq: Every day | ORAL | Status: DC
  Filled 2020-10-25: qty 2

## 2020-10-25 MED ORDER — GABAPENTIN 300 MG PO CAPS: 300.0000 mg | ORAL_CAPSULE | ORAL | Status: AC

## 2020-10-25 MED ORDER — ROCURONIUM BROMIDE 10 MG/ML (PF) SYRINGE
PREFILLED_SYRINGE | INTRAVENOUS | Status: DC | PRN
Start: 2020-10-25 — End: 2020-10-25
  Administered 2020-10-25: 20 mg via INTRAVENOUS
  Administered 2020-10-25: 100 mg via INTRAVENOUS

## 2020-10-25 MED ORDER — ENOXAPARIN SODIUM 30 MG/0.3ML IJ SOSY
30.0000 mg | PREFILLED_SYRINGE | Freq: Two times a day (BID) | INTRAMUSCULAR | Status: DC
  Administered 2020-10-25 – 2020-10-27 (×4): 30 mg via SUBCUTANEOUS
  Filled 2020-10-25 (×4): qty 0.3

## 2020-10-25 MED ORDER — PROMETHAZINE HCL 25 MG/ML IJ SOLN
INTRAMUSCULAR | Status: AC
  Filled 2020-10-25: qty 1

## 2020-10-25 MED ORDER — SODIUM CHLORIDE 0.9 % IV SOLN
2.0000 g | INTRAVENOUS | Status: AC
  Administered 2020-10-25: 2 g via INTRAVENOUS

## 2020-10-25 MED ORDER — SCOPOLAMINE 1 MG/3DAYS TD PT72
MEDICATED_PATCH | TRANSDERMAL | Status: AC
  Administered 2020-10-25: 1.5 mg via TRANSDERMAL
  Filled 2020-10-25: qty 1

## 2020-10-25 MED ORDER — ACETAMINOPHEN 500 MG PO TABS
ORAL_TABLET | ORAL | Status: AC
  Administered 2020-10-25: 1000 mg via ORAL
  Filled 2020-10-25: qty 2

## 2020-10-25 MED ORDER — FENTANYL CITRATE (PF) 100 MCG/2ML IJ SOLN
INTRAMUSCULAR | Status: AC
  Administered 2020-10-25: 50 ug via INTRAVENOUS
  Filled 2020-10-25: qty 2

## 2020-10-25 MED ORDER — LISINOPRIL-HYDROCHLOROTHIAZIDE 20-12.5 MG PO TABS: 2.0000 | ORAL_TABLET | Freq: Every morning | ORAL | Status: DC

## 2020-10-25 MED ORDER — CHLORHEXIDINE GLUCONATE 0.12 % MT SOLN
15.0000 mL | Freq: Once | OROMUCOSAL | Status: AC
  Administered 2020-10-25: 15 mL via OROMUCOSAL

## 2020-10-25 MED ORDER — HYDRALAZINE HCL 20 MG/ML IJ SOLN
INTRAMUSCULAR | Status: DC | PRN
  Administered 2020-10-25: 5 mg via INTRAVENOUS

## 2020-10-25 MED ORDER — OXYCODONE HCL 5 MG/5ML PO SOLN
5.0000 mg | Freq: Four times a day (QID) | ORAL | Status: DC | PRN
  Administered 2020-10-26 – 2020-10-27 (×2): 5 mg via ORAL
  Filled 2020-10-25 (×2): qty 5

## 2020-10-25 MED ORDER — AZELASTINE HCL 0.1 % NA SOLN
1.0000 | Freq: Every day | NASAL | Status: DC | PRN
  Filled 2020-10-25: qty 30

## 2020-10-25 MED ORDER — LACTATED RINGERS IV SOLN: INTRAVENOUS | Status: DC

## 2020-10-25 MED ORDER — LIP MEDEX EX OINT
TOPICAL_OINTMENT | CUTANEOUS | Status: AC
  Filled 2020-10-25: qty 7

## 2020-10-25 MED ORDER — HEPARIN SODIUM (PORCINE) 5000 UNIT/ML IJ SOLN
INTRAMUSCULAR | Status: AC
  Administered 2020-10-25: 5000 [IU] via SUBCUTANEOUS
  Filled 2020-10-25: qty 1

## 2020-10-25 MED ORDER — SIMETHICONE 80 MG PO CHEW: 80.0000 mg | CHEWABLE_TABLET | Freq: Four times a day (QID) | ORAL | Status: DC | PRN

## 2020-10-25 MED ORDER — ONDANSETRON HCL 4 MG/2ML IJ SOLN
INTRAMUSCULAR | Status: AC
  Filled 2020-10-25: qty 2

## 2020-10-25 MED ORDER — ONDANSETRON HCL 4 MG/2ML IJ SOLN
INTRAMUSCULAR | Status: DC | PRN
Start: 2020-10-25 — End: 2020-10-25
  Administered 2020-10-25: 4 mg via INTRAVENOUS

## 2020-10-25 MED ORDER — DEXAMETHASONE SODIUM PHOSPHATE 10 MG/ML IJ SOLN
INTRAMUSCULAR | Status: DC | PRN
  Administered 2020-10-25: 10 mg via INTRAVENOUS

## 2020-10-25 MED ORDER — AZELASTINE HCL 137 MCG/SPRAY NA SOLN: 1.0000 | Freq: Every day | NASAL | Status: DC | PRN

## 2020-10-25 MED ORDER — SODIUM CHLORIDE 0.9% FLUSH
INTRAVENOUS | Status: DC | PRN
  Administered 2020-10-25: 50 mL

## 2020-10-25 MED ORDER — AMLODIPINE BESYLATE 10 MG PO TABS
10.0000 mg | ORAL_TABLET | Freq: Every day | ORAL | Status: DC
  Administered 2020-10-25: 10 mg via ORAL
  Filled 2020-10-25 (×2): qty 1

## 2020-10-25 MED ORDER — APREPITANT 40 MG PO CAPS
ORAL_CAPSULE | ORAL | Status: AC
  Administered 2020-10-25: 40 mg via ORAL
  Filled 2020-10-25: qty 1

## 2020-10-25 MED ORDER — ACETAMINOPHEN 500 MG PO TABS
1000.0000 mg | ORAL_TABLET | Freq: Three times a day (TID) | ORAL | Status: DC
  Administered 2020-10-25 – 2020-10-27 (×5): 1000 mg via ORAL
  Filled 2020-10-25 (×5): qty 2

## 2020-10-25 MED ORDER — LIDOCAINE 2% (20 MG/ML) 5 ML SYRINGE
INTRAMUSCULAR | Status: AC
  Filled 2020-10-25: qty 5

## 2020-10-25 MED ORDER — DEXAMETHASONE SODIUM PHOSPHATE 10 MG/ML IJ SOLN
INTRAMUSCULAR | Status: AC
  Filled 2020-10-25: qty 1

## 2020-10-25 SURGICAL SUPPLY — 84 items
ADH SKN CLS APL DERMABOND .7 (GAUZE/BANDAGES/DRESSINGS) ×2
APL PRP STRL LF DISP 70% ISPRP (MISCELLANEOUS) ×4
APPLIER CLIP 5 13 M/L LIGAMAX5 (MISCELLANEOUS)
APPLIER CLIP ROT 10 11.4 M/L (STAPLE)
APR CLP MED LRG 11.4X10 (STAPLE)
APR CLP MED LRG 5 ANG JAW (MISCELLANEOUS)
BAG COUNTER SPONGE SURGICOUNT (BAG) ×3 IMPLANT
BAG SPNG CNTER NS LX DISP (BAG) ×2
BLADE SURG SZ11 CARB STEEL (BLADE) ×3 IMPLANT
CANNULA REDUC XI 12-8 STAPL (CANNULA) ×3
CANNULA REDUCER 12-8 DVNC XI (CANNULA) ×2 IMPLANT
CHLORAPREP W/TINT 26 (MISCELLANEOUS) ×6 IMPLANT
CLIP APPLIE 5 13 M/L LIGAMAX5 (MISCELLANEOUS) IMPLANT
CLIP APPLIE ROT 10 11.4 M/L (STAPLE) IMPLANT
COVER MAYO STAND STRL (DRAPES) ×3 IMPLANT
COVER SURGICAL LIGHT HANDLE (MISCELLANEOUS) ×3 IMPLANT
COVER TIP SHEARS 8 DVNC (MISCELLANEOUS) IMPLANT
COVER TIP SHEARS 8MM DA VINCI (MISCELLANEOUS)
DECANTER SPIKE VIAL GLASS SM (MISCELLANEOUS) ×3 IMPLANT
DERMABOND ADVANCED (GAUZE/BANDAGES/DRESSINGS) ×1
DERMABOND ADVANCED .7 DNX12 (GAUZE/BANDAGES/DRESSINGS) ×2 IMPLANT
DRAPE 3/4 80X56 (DRAPES) ×3 IMPLANT
DRAPE ARM DVNC X/XI (DISPOSABLE) ×6 IMPLANT
DRAPE COLUMN DVNC XI (DISPOSABLE) IMPLANT
DRAPE DA VINCI XI ARM (DISPOSABLE) ×12
DRAPE DA VINCI XI COLUMN (DISPOSABLE) ×3
DRSG TEGADERM 2-3/8X2-3/4 SM (GAUZE/BANDAGES/DRESSINGS) ×17 IMPLANT
ELECT REM PT RETURN 15FT ADLT (MISCELLANEOUS) ×3 IMPLANT
ENDOLOOP SUT PDS II  0 18 (SUTURE)
ENDOLOOP SUT PDS II 0 18 (SUTURE) IMPLANT
GAUZE 4X4 16PLY ~~LOC~~+RFID DBL (SPONGE) ×3 IMPLANT
GAUZE SPONGE 2X2 8PLY STRL LF (GAUZE/BANDAGES/DRESSINGS) ×2 IMPLANT
GLOVE SRG 8 PF TXTR STRL LF DI (GLOVE) ×2 IMPLANT
GLOVE SURG POLY ORTHO LF SZ7.5 (GLOVE) ×6 IMPLANT
GLOVE SURG UNDER POLY LF SZ8 (GLOVE) ×6
GOWN STRL REUS W/TWL LRG LVL3 (GOWN DISPOSABLE) ×9 IMPLANT
GOWN STRL REUS W/TWL XL LVL3 (GOWN DISPOSABLE) ×6 IMPLANT
GRASPER SUT TROCAR 14GX15 (MISCELLANEOUS) ×3 IMPLANT
KIT BASIN OR (CUSTOM PROCEDURE TRAY) ×3 IMPLANT
MARKER SKIN DUAL TIP RULER LAB (MISCELLANEOUS) ×3 IMPLANT
MAT PREVALON FULL STRYKER (MISCELLANEOUS) ×3 IMPLANT
NDL INSUFFLATION 14GA 120MM (NEEDLE) IMPLANT
NDL SPNL 22GX3.5 QUINCKE BK (NEEDLE) ×2 IMPLANT
NEEDLE INSUFFLATION 14GA 120MM (NEEDLE) IMPLANT
NEEDLE SPNL 22GX3.5 QUINCKE BK (NEEDLE) ×3 IMPLANT
PACK CARDIOVASCULAR III (CUSTOM PROCEDURE TRAY) ×3 IMPLANT
RELOAD STAPLE 60 2.5 WHT DVNC (STAPLE) IMPLANT
RELOAD STAPLE 60 3.5 BLU DVNC (STAPLE) ×2 IMPLANT
RELOAD STAPLE 60 4.3 GRN DVNC (STAPLE) ×2 IMPLANT
RELOAD STAPLER 2.5X60 WHT DVNC (STAPLE) ×12 IMPLANT
RELOAD STAPLER 3.5X60 BLU DVNC (STAPLE) ×2 IMPLANT
RELOAD STAPLER 4.3X60 GRN DVNC (STAPLE) IMPLANT
SCISSORS LAP 5X35 DISP (ENDOMECHANICALS) IMPLANT
SEAL CANN UNIV 5-8 DVNC XI (MISCELLANEOUS) ×4 IMPLANT
SEAL XI 5MM-8MM UNIVERSAL (MISCELLANEOUS) ×9
SEALER VESSEL DA VINCI XI (MISCELLANEOUS) ×3
SEALER VESSEL EXT DVNC XI (MISCELLANEOUS) ×2 IMPLANT
SET IRRIG TUBING LAPAROSCOPIC (IRRIGATION / IRRIGATOR) ×3 IMPLANT
SLEEVE GASTRECTOMY 40FR VISIGI (MISCELLANEOUS) ×3 IMPLANT
SOL ANTI FOG 6CC (MISCELLANEOUS) ×2 IMPLANT
SOLUTION ANTI FOG 6CC (MISCELLANEOUS) ×1
SOLUTION ELECTROLUBE (MISCELLANEOUS) ×3 IMPLANT
SPONGE GAUZE 2X2 STER 10/PKG (GAUZE/BANDAGES/DRESSINGS) ×1
STAPLER 60 DA VINCI SURE FORM (STAPLE) ×3
STAPLER 60 SUREFORM DVNC (STAPLE) ×2 IMPLANT
STAPLER CANNULA SEAL DVNC XI (STAPLE) ×2 IMPLANT
STAPLER CANNULA SEAL XI (STAPLE) ×3
STAPLER RELOAD 2.5X60 WHITE (STAPLE) ×18
STAPLER RELOAD 2.5X60 WHT DVNC (STAPLE) ×12
STAPLER RELOAD 3.5X60 BLU DVNC (STAPLE) ×2
STAPLER RELOAD 3.5X60 BLUE (STAPLE) ×3
STAPLER RELOAD 4.3X60 GREEN (STAPLE)
STAPLER RELOAD 4.3X60 GRN DVNC (STAPLE)
SUT MNCRL AB 4-0 PS2 18 (SUTURE) ×3 IMPLANT
SUT VICRYL 0 TIES 12 18 (SUTURE) ×3 IMPLANT
SUT VLOC 180 2-0 9IN GS21 (SUTURE) IMPLANT
SYR 20ML LL LF (SYRINGE) ×3 IMPLANT
TAPE STRIPS DRAPE STRL (GAUZE/BANDAGES/DRESSINGS) ×2 IMPLANT
TOWEL OR 17X26 10 PK STRL BLUE (TOWEL DISPOSABLE) ×3 IMPLANT
TOWEL OR NON WOVEN STRL DISP B (DISPOSABLE) ×3 IMPLANT
TRAY FOLEY MTR SLVR 16FR STAT (SET/KITS/TRAYS/PACK) IMPLANT
TROCAR ADV FIXATION 5X100MM (TROCAR) IMPLANT
TROCAR BLADELESS OPT 5 100 (ENDOMECHANICALS) ×3 IMPLANT
TUBING INSUFFLATION 10FT LAP (TUBING) ×3 IMPLANT

## 2020-10-25 NOTE — Anesthesia Procedure Notes (Signed)
Procedure Name: Intubation Date/Time: 10/25/2020 1:57 PM Performed by: Eben Burow, CRNA Pre-anesthesia Checklist: Patient identified, Emergency Drugs available, Suction available, Patient being monitored and Timeout performed Patient Re-evaluated:Patient Re-evaluated prior to induction Oxygen Delivery Method: Circle system utilized Preoxygenation: Pre-oxygenation with 100% oxygen Induction Type: IV induction Ventilation: Mask ventilation without difficulty Laryngoscope Size: Mac and 4 Grade View: Grade II Tube type: Oral Tube size: 7.0 mm Number of attempts: 1 Airway Equipment and Method: Stylet Placement Confirmation: ETT inserted through vocal cords under direct vision, positive ETCO2 and breath sounds checked- equal and bilateral Secured at: 21 cm Tube secured with: Tape Dental Injury: Teeth and Oropharynx as per pre-operative assessment

## 2020-10-25 NOTE — Interval H&P Note (Signed)
History and Physical Interval Note:  10/25/2020 1:24 PM  Brandi Henry  has presented today for surgery, with the diagnosis of MORBID OBESITY.  The various methods of treatment have been discussed with the patient and family. After consideration of risks, benefits and other options for treatment, the patient has consented to  Procedure(s): XI ROBOT ASSISTED GASTRIC SLEEVE RESECTION (N/A) UPPER GI ENDOSCOPY (N/A) as a surgical intervention.  The patient's history has been reviewed, patient examined, no change in status, stable for surgery.  I have reviewed the patient's chart and labs.  Questions were answered to the patient's satisfaction.     Greer Pickerel

## 2020-10-25 NOTE — Discharge Instructions (Addendum)
HOLD LISINOPRIL/HCTZ ON DISCHARGE  MONITOR BP DAILY IF SYSTOLIC BLOOD PRESSURE (TOP NUMBER) IS ABOVE 116mHg and/or bottom number is above 90 - resume lisinopril/hctz       GASTRIC BYPASS/SLEEVE  Home Care Instructions   These instructions are to help you care for yourself when you go home.  Call: If you have any problems. Call [phone] ask for the surgeon on call If you need immediate help[facility]  Tell the ER staff that you are a new post-op gastric bypass or gastric sleeve patient   Signs and symptoms to report: Severe vomiting or nausea If you cannot keep down clear liquids for longer than 1 day, call your surgeon  Abdominal pain that does not get better after taking your pain medication Fever over 100.4 F with chills Heart beating over 100 beats a minute Shortness of breath at rest Chest pain  Redness, swelling, drainage, or foul odor at incision (surgical) sites  If your incisions open or pull apart Swelling or pain in calf (lower leg) Diarrhea (Loose bowel movements that happen often), frequent watery, uncontrolled bowel movements Constipation, (no bowel movements for 3 days) if this happens: Pick one Milk of Magnesia, 2 tablespoons by mouth, 3 times a day for 2 days if needed Stop taking Milk of Magnesia once you have a bowel movement Call your doctor if constipation continues Or Miralax  (instead of Milk of Magnesia) following the label instructions Stop taking Miralax once you have a bowel movement Call your doctor if constipation continues Anything you think is not normal   Normal side effects after surgery: Unable to sleep at night or unable to focus Irritability or moody Being tearful (crying) or depressed These are common complaints, possibly related to your anesthesia medications that put you to sleep, stress of surgery, and change in lifestyle.  This usually goes away a few weeks after surgery.  If these feelings continue,  call your primary care doctor.   Wound Care: You may have surgical glue, steri-strips, or staples over your incisions after surgery Surgical glue:  Looks like a clear film over your incisions and will wear off a little at a time Steri-strips: Strips of tape over your incisions. You may notice a yellowish color on the skin under the steri-strips. This is used to make the   steri-strips stick better. Do not pull the steri-strips off - let them fall off Staples: Staples may be removed before you leave the hospital If you go home with staples, call [facility], [phone] at for an appointment with your surgeon's nurse to have staples removed 10 days after surgery. Showering: You may shower two (2) days after your surgery unless your surgeon tells you differently Wash gently around incisions with warm soapy water, rinse well, and gently pat dry  No tub baths until staples are removed, steri-strips fall off or glue is gone.    Medications: Medications should be liquid or crushed if larger than the size of a dime Extended release pills (medication that release a little bit at a time through the day) should NOT be crushed or cut. (examples include XL, ER, DR, SR) Depending on the size and number of medications you take, you may need to space (take a few throughout the day)/change the time you take your medications so that you do not over-fill your pouch (smaller stomach) Make sure you follow-up with your primary care doctor to make medication changes needed during rapid weight  loss and life-style changes If you have diabetes, follow up with the doctor that orders your diabetes medication(s) within one week after surgery and check your blood sugar regularly. Do not drive while taking prescription pain medication  It is ok to take Tylenol by the bottle instructions with your pain medicine or instead of your pain medicine as needed.  DO NOT TAKE NSAIDS (EXAMPLES OF NSAIDS:  IBUPROFREN/  NAPROXEN)  Diet:                    First 2 Weeks  You will see the dietician t about two (2) weeks after your surgery. The dietician will increase the types of foods you can eat if you are handling liquids well: If you have severe vomiting or nausea and cannot keep down clear liquids lasting longer than 1 day, call your surgeon @ [phone]) Protein Shake Drink at least 2 ounces of shake 5-6 times per day Each serving of protein shakes (usually 8 - 12 ounces) should have: 15 grams of protein  And no more than 5 grams of carbohydrate  Goal for protein each day: Men = 80 grams per day Women = 60 grams per day Protein powder may be added to fluids such as non-fat milk or Lactaid milk or unsweetened Soy/Almond milk (limit to 35 grams added protein powder per serving)  Hydration Slowly increase the amount of water and other clear liquids as tolerated (See Acceptable Fluids) Slowly increase the amount of protein shake as tolerated   Sip fluids slowly and throughout the day.  Do not use straws. May use sugar substitutes in small amounts (no more than 6 - 8 packets per day; i.e. Splenda)  Fluid Goal The first goal is to drink at least 8 ounces of protein shake/drink per day (or as directed by the nutritionist); some examples of protein shakes are [employer], [employer], EAS Edge HP, and [place]. See handout from pre-op Bariatric Education Class: Slowly increase the amount of protein shake you drink as tolerated You may find it easier to slowly sip shakes throughout the day It is important to get your proteins in first Your fluid goal is to drink 64 - 100 ounces of fluid daily It may take a few weeks to build up to this 32 oz (or more) should be clear liquids  And  32 oz (or more) should be full liquids (see below for examples) Liquids should not contain sugar, caffeine, or carbonation  Clear Liquids: Water or Sugar-free flavored water (i.e. Fruit H2O, Propel) Decaffeinated  coffee or tea (sugar-free) Crystal Lite, Wyler's Lite, Minute Maid Lite Sugar-free Jell-O Bouillon or broth Sugar-free Popsicle:   *Less than 20 calories each; Limit 1 per day  Full Liquids: Protein Shakes/Drinks + 2 choices per day of other full liquids Full liquids must be: No More Than 15 grams of Carbs per serving  No More Than 3 grams of Fat per serving Strained low-fat cream soup (except Cream of Potato or Tomato) Non-Fat milk Fat-free Lactaid Milk Unsweetened Soy Or Unsweetened Almond Milk Low Sugar yogurt (Dannon Lite & Fit, Greek yogurt; Oikos Triple Zero; Chobani Simply 100; Yoplait 100 calorie [place] - No Fruit on the Bottom)    Vitamins and Minerals Start 1 day after surgery unless otherwise directed by your surgeon 2 Chewable Bariatric Specific Multivitamin / Multimineral Supplement with iron (Example: Bariatric Advantage Multi EA) Chewable Calcium with Vitamin D-3 (Example: 3 Chewable Calcium Plus 600 with Vitamin D-3) Take 500 mg three (  3) times a day for a total of 1500 mg each day Do not take all 3 doses of calcium at one time as it may cause constipation, and you can only absorb 500 mg  at a time  Do not mix multivitamins containing iron with calcium supplements; take 2 hours apart Menstruating women and those with a history of anemia (a blood disease that causes weakness) may need extra iron Talk with your doctor to see if you need more iron Do not stop taking or change any vitamins or minerals until you talk to your dietitian or surgeon Your Dietitian and/or surgeon must approve all vitamin and mineral supplements   Activity and Exercise: Limit your physical activity as instructed by your doctor.  It is important to continue walking at home.  During this time, use these guidelines: Do not lift anything greater than ten (10) pounds for at least two (2) weeks Do not go back to work or drive until Engineer, production says you can You may have sex when you feel comfortable   It is VERY important for female patients to use a reliable birth control method; fertility often increases after surgery  All hormonal birth control will be ineffective for 30 days after surgery due to medications given during surgery a barrier method must be used. Do not get pregnant for at least 18 months Start exercising as soon as your doctor tells you that you can Make sure your doctor approves any physical activity Start with a simple walking program Walk 5-15 minutes each day, 7 days per week.  Slowly increase until you are walking 30-45 minutes per day Consider joining our [place] program. [phone] or email [email or web address]   Special Instructions Things to remember: Use your CPAP when sleeping if this applies to you  [facility] has two free Bariatric Surgery Support Groups that meet monthly The 3rd Thursday of each month, 6 pm, [place]  The 2nd Friday of each month, 11:45 am in the private dining room in the basement of [place] It is very important to keep all follow up appointments with your surgeon, dietitian, primary care physician, and behavioral health practitioner Routine follow up schedule with your surgeon include appointments at 2-3 weeks, 6-8 weeks, 6 months, and 1 year at a minimum.  Your surgeon may request to see you more often.   After the first year, please follow up with your bariatric surgeon and dietitian at least once a year in order to maintain best weight loss results [facility]: [place]: [phone] Bariatric Nurse Coordinator: [phone]      Reviewed and Endorsed  by [facility] Patient Education Committee, June, 2016 Edits Approved: Aug, 2018

## 2020-10-25 NOTE — Transfer of Care (Signed)
Immediate Anesthesia Transfer of Care Note  Patient: Brandi Henry  Procedure(s) Performed: XI ROBOT ASSISTED GASTRIC SLEEVE RESECTION (Abdomen) UPPER GI ENDOSCOPY  Patient Location: PACU  Anesthesia Type:General  Level of Consciousness: awake, alert  and patient cooperative  Airway & Oxygen Therapy: Patient Spontanous Breathing and Patient connected to face mask oxygen  Post-op Assessment: Report given to RN and Post -op Vital signs reviewed and stable  Post vital signs: Reviewed and stable  Last Vitals:  Vitals Value Taken Time  BP 127/65 10/25/20 1605  Temp    Pulse 70 10/25/20 1607  Resp 22 10/25/20 1607  SpO2 100 % 10/25/20 1607  Vitals shown include unvalidated device data.  Last Pain:  Vitals:   10/25/20 1202  TempSrc:   PainSc: 0-No pain         Complications: No notable events documented.

## 2020-10-25 NOTE — H&P (Signed)
PROVIDER: Tovah Slavick Leanne Chang, MD  MRN: [id number] DOB: 18-Aug-1955 DATE OF ENCOUNTER: 10/14/2020 Subjective   Chief Complaint: pre op for bariatric surgery   History of Present Illness: Brandi Henry is a 65 y.o. female who is seen today for follow-up regarding her severe obesity and comorbidities. She has completed the bariatric surgery evaluation process. I initially met her on August 04, 2020. Her weight at that time was 323.4 pounds with a BMI of 59. She received psychological and nutritional clearance.  She had cataract surgery back in July and on her right eye and reports she is no longer needing to wear glasses. She is been starting her preoperative meal plan and is down 5 pounds. She is also been going to the gym recently and has had 8 sessions with a personal trainer  Her upper GI showed mild intermittent esophageal dysmotility with tertiary contractions. No hiatal hernia. Chest x-ray within normal limits.  She just saw her primary care physician on July 18 and I reviewed that office note. I also reviewed a full panel labs that she had on July 11. Total cholesterol was 207, LDL 133, A1c 5.9; CBC and comprehensive metabolic panel were okay. She has had a mildly persistent and borderline leukocytosis for over a year.  Review of Systems: A complete review of systems was obtained from the patient. I have reviewed this information and discussed as appropriate with the patient. See HPI as well for other ROS.  ROS   Medical History: Past Medical History:  Diagnosis Date   Allergic state Sulfa   Congenital absence of one kidney  Has right kidney   Encounter for blood transfusion 07/1980  after C-Section   History of blood transfusion 07/1980  after C-Section   History of cancer 06/30/2019  basal cell carcinoma   Hypertension   Obesity   Pure hypercholesterolemia (LDL 136 - 01/16/17) - diet controlled 01/16/2017   Sleep apnea  on CPAP   Thyroid disease   Patient Active Problem  List  Diagnosis   Essential hypertension   Acquired hypothyroidism (TSH 3 - 09/13/20)   OSA on CPAP   Morbid obesity due to excess calories ([place])   Single kidney   Borderline diabetes mellitus (A1c 5.9% - 09/13/20) - diet controlled   Mixed hyperlipidemia (LDL 133 - 09/13/20)   Past Surgical History:  Procedure Laterality Date   CATARACT EXTRACTION   CESAREAN SECTION 07/1980   COLONOSCOPY 04/17/2011  Dr. Kennis Carina @ [facility] - Adenomatous Polyp, FHCC(f), CBF 04/2016; Recall Ltr 02/25/2016   COLONOSCOPY 09/03/2007  Dr. Keturah Barre. Wohl @ [facility] - Adenomatous Polyp, polyps 7-9m   COLONOSCOPY 03/15/2020  Diverticulosis/Otherwise normal colon/PHx CP/Repeat 582yrCTL   HYSTERECTOMY 2007   Right thumb surgery 02/10/2016  Done by Dr. GrAmedeo Plenty  Allergies  Allergen Reactions   Sulfa (Sulfonamide Antibiotics) Hives   Current Outpatient Medications on File Prior to Visit  Medication Sig Dispense Refill   amLODIPine (NORVASC) 10 MG tablet TAKE ONE TABLET BY MOUTH ONE TIME DAILY 90 tablet 0   aspirin 81 MG EC tablet Take 81 mg by mouth once daily.   azelastine (ASTELIN) 137 mcg nasal spray   azelastine (ASTELIN) 137 mcg nasal spray Place into one nostril   escitalopram oxalate (LEXAPRO) 10 MG tablet TAKE 1 TABLET(10 MG) BY MOUTH EVERY DAY 90 tablet 1   escitalopram oxalate (LEXAPRO) 10 MG tablet Take by mouth   levothyroxine (SYNTHROID) 125 MCG tablet TAKE ONE TABLET BY MOUTH EVERY DAY 30-60 MINUTES BEFORE  BREAKFAST ON AN EMPTY STOMACH WITH A GLASS OF WATER 90 tablet 0   levothyroxine (SYNTHROID) 125 MCG tablet Take by mouth   lisinopriL-hydrochlorothiazide (ZESTORETIC) 20-12.5 mg tablet TAKE TWO TABLETS BY MOUTH ONE TIME DAILY 180 tablet 0   mometasone (ELOCON) 0.1 % lotion   multivitamin with minerals tablet Take 1 capsule by mouth once daily   pravastatin (PRAVACHOL) 40 MG tablet Take 1 tablet (40 mg total) by mouth nightly 90 tablet 1   pravastatin (PRAVACHOL) 40 MG tablet Take by mouth   aspirin  81 MG EC tablet Take by mouth   cetirizine (ZYRTEC) 10 MG tablet Take 10 mg by mouth once daily (Patient not taking: Reported on 10/14/2020)   Herbal Supplement Herbal Name: DOTERRA alpha CRS+ (cellualar vitality complex), micro plex Vm (food nutrient complex), xEO MEGA ( Essential Oil Omega Complex)- takes BID (Patient not taking: Reported on 10/14/2020)   Herbal Supplement Doterra Digestive Enzymes- takes PRN   ipratropium (ATROVENT) 0.03 % nasal spray Place 2 sprays into both nostrils once daily as needed   MSM/ALOE VERA/EMU OIL/HERBAL66 (DEEP BLUE RELIEF TOP) Apply topically continuously as needed.  (Patient not taking: Reported on 10/14/2020)   triamcinolone 0.5 % cream Apply topically 2 (two) times daily Apply 2 x per day (up to 7-10 days) (Patient not taking: Reported on 10/14/2020) 30 g 1   No current facility-administered medications on file prior to visit.   Family History  Problem Relation Age of Onset   Lung cancer Mother   Heart disease Mother   Colon cancer Father   Heart disease Brother   No Known Problems Sister    Social History   Tobacco Use  Smoking Status Never Smoker  Smokeless Tobacco Never Used    Social History   Socioeconomic History   Marital status: Single  Tobacco Use   Smoking status: Never Smoker   Smokeless tobacco: Never Used  Scientific laboratory technician Use: Never used  Substance and Sexual Activity   Alcohol use: Not Currently  Alcohol/week: 0.0 standard drinks  Comment: a glass of wine once in while   Drug use: No   Sexual activity: Not Currently  Birth control/protection: Surgical  Other Topics Concern   Would you please tell us about the people who live in your home, your pets, or anything else important to your social life? Yes  Comment: Daughter and granddaughter live with me. Have a dog  Social History Narrative  Marital Status- Divorced  Lives with daughter and granddaughter  Employment- Retired [employer])  Exercise hx- None  Religious  Affiliation- Lutheran   Objective:   Vitals:  10/14/20 0844  BP: (!) 160/82  Pulse: 86  Temp: 36.8 C (98.2 F)  SpO2: 97%  Weight: (!) 142.9 kg (315 lb)  Height: 157.5 cm (_0 )  PainSc: 0-No pain   Body mass index is 57.61 kg/m.  Gen: alert, NAD, non-toxic appearing Pupils: equal, no scleral icterus Pulm: Lungs clear to auscultation, symmetric chest rise CV: regular rate and rhythm Abd: soft, nontender, nondistended. Old incision. No cellulitis. No incisional hernia Ext: no edema,  Skin: no rash, no jaundice  Labs, Imaging and Diagnostic Testing:  See above  Assessment and Plan:  Diagnoses and all orders for this visit:  Morbid obesity due to excess calories ([place])  OSA on CPAP  Essential hypertension  Mixed hyperlipidemia (LDL 133 - 09/13/20)   Single kidney  Borderline diabetes mellitus (A1c 5.9% - 09/13/20) - diet controlled  Acquired hypothyroidism (TSH  3 - 09/13/20)    We reviewed her preoperative work-up. We discussed her imaging studies and recent labs. She has attended her preoperative bariatric education class. We rediscussed the typical hospitalization. We rediscussed the typical postoperative course. We rediscussed the typical issues that we see in the first few weeks. I offered to rediscuss the actual steps of the procedure but she declined. Also offered to rediscuss the risk and benefits but she declined. We had extensively discussed those back in June. All of her questions were asked and answered. Congratulated her on the weight loss as well as working out consistently  This patient encounter took 25 minutes today to perform the following: take history, perform exam, review outside records, interpret imaging, counsel the patient on their diagnosis and document encounter, findings & plan in the EHR  No follow-ups on file.  Leighton Ruff. Corita Allinson MD FACS General, Minimally Invasive, & Bariatric Surgery

## 2020-10-25 NOTE — Op Note (Signed)
10/25/2020 MURDIS HOLEMAN Jul 30, 1955 [id number]   PRE-OPERATIVE DIAGNOSIS:   Morbid obesity due to excess calories bmi 56  OSA on CPAP  Essential hypertension  Mixed hyperlipidemia (LDL 133 - 09/13/20)   Single kidney  Borderline diabetes mellitus (A1c 5.9% - 09/13/20) - diet controlled  Acquired hypothyroidism (TSH 3 - 09/13/20)   POST-OPERATIVE DIAGNOSIS:  same  PROCEDURE:  Procedure(s): Xi robotic SLEEVE GASTRECTOMY UPPER GI ENDOSCOPY  SURGEON:  Surgeon(s): Gayland Curry, MD FACS FASMBS  ASSISTANTS: Johnathan Hausen MD FACS  ANESTHESIA:   general  DRAINS: none   BOUGIE: 40 fr ViSiGi  LOCAL MEDICATIONS USED:   Exparel  EBL: minimal  SPECIMEN:  Source of Specimen:  Greater curvature of stomach  DISPOSITION OF SPECIMEN:  PATHOLOGY  COUNTS:  YES  INDICATION FOR PROCEDURE: This is a very pleasant 65 y.o.-year-old severely obese female who has had unsuccessful attempts for sustained weight loss. The patient presents today for a planned laparoscopic/robotic sleeve gastrectomy with upper endoscopy. We have discussed the risk and benefits of the procedure extensively preoperatively. Please see my separate notes.  PROCEDURE: After obtaining informed consent and receiving 5000 units of subcutaneous heparin, the patient was brought to the operating room at [facility] and placed supine on the operating room table. General endotracheal anesthesia was established. Sequential compression devices were placed. A orogastric tube was placed. The patient's abdomen was prepped and draped in the usual standard surgical fashion. The patient received preoperative IV antibiotic. A surgical timeout was performed. ERAS protocol used.   Access to the abdomen was achieved using a 5 mm 0 laparoscope thru a 5 mm trocar In the mid clavicular line in the left midabdomen about 3 cm to the left & slight below the level of the umbilicus using the Optiview technique. Pneumoperitoneum was smoothly  established up to 15 mm of mercury. The laparoscope was advanced and the abdominal cavity was surveilled. The patient was then placed in reverse Trendelenburg.  A tap block was performed on patient's right side under direct visualization.  A 12 mm robotic trocar was placed in the mid right abdomen.  The patient was rotated to the right.  The optical entry trocar in the left midabdomen was changed to a 8 mm robotic trocar under direct visualization. An 36m robotic trocar was placed in the lateral left abdominal wall also under direct visualization.  The [facility] ret[facility] was placed under the left lobe of the liver through a 5 mm trocar incision site in the subxiphoid position. A final 5 mm trocar was placed in the lateral RUQ.  A tap block was performed along the patient's left side also under direct laparoscopic visualization.     The XI robot was then docked and targeted for upper abdominal anatomy.  Arm 2  was attached to the left paraumbilical abdominal trocar and the camera was inserted.  Anatomy was targeted.  Arms 1 and ,3,4 were then attached to the robotic trochars.  A fenestrated bipolar was placed in arm 1.  A vessel sealer was placed in arm 3, tip up grasper in arm 4.  My assistant stayed at the bedside while I went to the robotic console.  The stomach was inspected. It was completely decompressed and the orogastric tube was removed.  There was no anterior dimple that was obviously visible. The patient had a very long almost U shaped stomach.     We identified the pylorus and measured 6 cm proximal to the pylorus and identified an  area of where we would start taking down the short gastric vessels.  The vessel sealer was used to take down the short gastric vessels along the greater curvature of the stomach. We were able to enter the lesser sac. We continued to march along the greater curvature of the stomach taking down the short gastrics.  My assistant provided traction laterally to  patient's right side.  as we approached the gastrosplenic ligament we took care in this area not to injure the spleen. We were able to take down the entire gastrosplenic ligament. We then mobilized the fundus away from the left crus of diaphragm. There were not any significant posterior gastric avascular attachments. This left the stomach completely mobilized. No vessels had been taken down along the lesser curvature of the stomach. There was no posterior plug of fat at the hiatus.    We then reidentified the pylorus. A 40Fr ViSiGi was then placed in the oropharynx and advanced down into the stomach and placed in the distal antrum and positioned along the lesser curvature. It was placed under suction which secured the 40Fr ViSiGi in place along the lesser curve. Then using the robotic 60 mm stapler with a blue load, I placed a stapler along the antrum approximately 6cm from the pylorus. The stapler was angled so that there is ample room at the angularis incisura. I then fired the first staple load after inspecting it posteriorly to ensure adequate space both anteriorly and posteriorly.  I uses a 2nd 60 mm blue load staple cartridge. The stapler did not have to pause for compression so at this point I started using white loads. The robotic stapler was then repositioned with a 60 mm white load  and we continued to march up along the [place]. My assistant was exchanging the stapler after firing. The tip up grasper was holding traction along the greater curvature stomach along the cauterized short gastric vessels ensuring that the stomach was symmetrically retracted. Prior to each firing of the staple, we rotated the stomach to ensure that there is adequate stomach left.  As we approached the fundus, I continued using 60 mm white cartridge aiming  lateral to the GE junction after mobilizing some of the esophageal fat pad.  The sleeve was inspected. There is no evidence of cork screw. The staple line appeared hemostatic   The CRNA inflated the ViSiGi to the green zone and the upper abdomen was flooded with saline. There were no bubbles. The sleeve was decompressed and the ViSiGi removed.I performed an upper endoscopy.  The endoscope was placed in the patient's oropharynx and gently glided down.  Z-line at approximately 35 cm.  The endoscope was advanced into the gastric sleeve and insufflated.  I was able to easily advance the endoscope down into the antrum.  Pylorus was visible.  There is no significant stricture at the incisura.  There was no corkscrewing.  The external staple line was re-inspected and there was no bleeding. There is no evidence of internal bleeding.  The sleeve was decompressed and the endoscope was removed.   The greater curvature the stomach was grasped with a laparoscopic grasper.  At this point I scrubbed back in.  The robot was undocked.  We removed the specimen from the 12 mm trocar site.  The liver retractor was removed. I then closed the 12 mm trocar site with 1 interrupted 0 Vicryl sutures through the fascia using the endoclose. The closure was viewed laparoscopically and it was airtight. Remaining Exparel was  then infiltrated in the preperitoneal spaces around the trocar sites. Pneumoperitoneum was released. All trocar sites were closed with a 4-0 Monocryl in a subcuticular fashion followed by the application of steri-strips, gauze, & tegaderms. The patient was extubated and taken to the recovery room in stable condition. All needle, instrument, and sponge counts were correct x2. There are no immediate complications  (1) 60 mm blue (6) 60 mm white  PLAN OF CARE: Admit to inpatient   PATIENT DISPOSITION:  PACU - hemodynamically stable.   Delay start of Pharmacological VTE agent (>24hrs) due to surgical blood loss or risk of bleeding:  no   Leighton Ruff. Redmond Pulling, MD, FACS FASMBS General, Bariatric, & Minimally Invasive Surgery [facility], [place]

## 2020-10-25 NOTE — Progress Notes (Signed)
PHARMACY CONSULT FOR:  Risk Assessment for Post-Discharge VTE Following Bariatric Surgery  Post-Discharge VTE Risk Assessment: This patient's probability of 30-day post-discharge VTE is increased due to the factors marked:   Female   X Age >/=60 years   X BMI >/=50 kg/m2    CHF    Dyspnea at Rest    Paraplegia   X Non-gastric-band surgery    Operation Time >/=3 hr    Return to OR     Length of Stay >/= 3 d   Hx of VTE   Hypercoagulable condition   Significant venous stasis       Predicted probability of 30-day post-discharge VTE: 0.52%   Recommendation for Discharge: Enoxaparin 60 mg Dupree q12h x 2 weeks post-discharge   Brandi Henry is a 65 y.o. female who underwent sleeve gastrectomy on 10/25/20   Case start: 1415 Case end: 1554   Allergies  Allergen Reactions   Elemental Sulfur Hives    Thirty years ago   Sulfa Antibiotics Hives    Patient Measurements: Height: '5\' 2"'$  (157.5 cm) Weight: (!) 137.9 kg (304 lb) IBW/kg (Calculated) : 50.1 Body mass index is 55.6 kg/m.  No results for input(s): WBC, HGB, HCT, PLT, APTT, CREATININE, LABCREA, CREATININE, CREAT24HRUR, MG, PHOS, ALBUMIN, PROT, ALBUMIN, AST, ALT, ALKPHOS, BILITOT, BILIDIR, IBILI in the last 72 hours. Estimated Creatinine Clearance: 96.8 mL/min (by C-G formula based on SCr of 0.65 mg/dL).    Past Medical History:  Diagnosis Date   Actinic keratosis    Arthritis    Basal cell carcinoma 05/06/2019   Left mid forearm. BCC with sclerosis. Excised: 06/30/2019, margins free   Congenital absence of one kidney    no left kidney   Dyspnea    Hypertension    Hypothyroidism    Obesity    PONV (postoperative nausea and vomiting)    Sleep apnea    wears CPAP     Medications Prior to Admission  Medication Sig Dispense Refill Last Dose   amLODipine (NORVASC) 10 MG tablet Take 10 mg by mouth at bedtime.   10/24/2020   aspirin EC 81 MG tablet Take 81 mg by mouth in the morning.   10/19/2020   Azelastine HCl  137 MCG/SPRAY SOLN Place 1 spray into both nostrils daily as needed (sinuses).   Past Month   escitalopram (LEXAPRO) 10 MG tablet Take 10 mg by mouth in the morning.   10/25/2020 at 0830   hydroxypropyl methylcellulose / hypromellose (ISOPTO TEARS / GONIOVISC) 2.5 % ophthalmic solution Place 1 drop into both eyes 3 (three) times daily as needed for dry eyes.   10/25/2020 at 0930   levothyroxine (SYNTHROID) 125 MCG tablet Take 125 mcg by mouth daily before breakfast.   10/25/2020 at 0530   lisinopril-hydrochlorothiazide (PRINZIDE,ZESTORETIC) 20-12.5 MG tablet Take 2 tablets by mouth in the morning.   10/24/2020   mometasone (ELOCON) 0.1 % lotion Apply 1 application topically daily as needed (irritation).   Past Month   Multiple Vitamins-Minerals (BARIATRIC MULTIVITAMINS/IRON PO) Take 1 capsule by mouth daily.   10/24/2020   NON FORMULARY Pt uses cpap nightly   10/24/2020   pravastatin (PRAVACHOL) 40 MG tablet Take 40 mg by mouth at bedtime.   10/24/2020   triamcinolone cream (KENALOG) 0.1 % Apply to rash on lower back and right foot 1-2 times a day until improved. Avoid face, groin, underarms. (Patient not taking: No sig reported) 80 g 0 Not Taking       Lynelle Doctor  10/25/2020,4:40 PM

## 2020-10-25 NOTE — Anesthesia Preprocedure Evaluation (Addendum)
Anesthesia Evaluation  Patient identified by MRN, date of birth, ID band Patient awake    Reviewed: Allergy & Precautions, NPO status , Patient's Chart, lab work & pertinent test results  History of Anesthesia Complications (+) PONV and history of anesthetic complications  Airway Mallampati: II  TM Distance: >3 FB Neck ROM: Full    Dental no notable dental hx. (+) Dental Advisory Given   Pulmonary sleep apnea and Continuous Positive Airway Pressure Ventilation ,    Pulmonary exam normal        Cardiovascular hypertension, Pt. on medications negative cardio ROS Normal cardiovascular exam     Neuro/Psych negative neurological ROS     GI/Hepatic negative GI ROS, Neg liver ROS,   Endo/Other  Hypothyroidism   Renal/GU negative Renal ROS     Musculoskeletal negative musculoskeletal ROS (+)   Abdominal   Peds  Hematology negative hematology ROS (+)   Anesthesia Other Findings   Reproductive/Obstetrics                            Anesthesia Physical Anesthesia Plan  ASA: 3  Anesthesia Plan: General   Post-op Pain Management:    Induction: Intravenous  PONV Risk Score and Plan: 4 or greater and Ondansetron, Dexamethasone, Scopolamine patch - Pre-op and Midazolam  Airway Management Planned: Oral ETT  Additional Equipment:   Intra-op Plan:   Post-operative Plan: Extubation in OR  Informed Consent: I have reviewed the patients History and Physical, chart, labs and discussed the procedure including the risks, benefits and alternatives for the proposed anesthesia with the patient or authorized representative who has indicated his/her understanding and acceptance.     Dental advisory given  Plan Discussed with: Anesthesiologist and CRNA  Anesthesia Plan Comments:        Anesthesia Quick Evaluation

## 2020-10-25 NOTE — Anesthesia Postprocedure Evaluation (Signed)
Anesthesia Post Note  Patient: Brandi Henry  Procedure(s) Performed: XI ROBOT ASSISTED GASTRIC SLEEVE RESECTION (Abdomen) UPPER GI ENDOSCOPY     Patient location during evaluation: PACU Anesthesia Type: General Level of consciousness: sedated Pain management: pain level controlled Vital Signs Assessment: post-procedure vital signs reviewed and stable Respiratory status: spontaneous breathing and respiratory function stable Cardiovascular status: stable Postop Assessment: no apparent nausea or vomiting Anesthetic complications: no   No notable events documented.  Last Vitals:  Vitals:   10/25/20 1727 10/25/20 1730  BP:  (!) 153/83  Pulse: 75 75  Resp: 16 18  Temp:    SpO2: 99% 100%    Last Pain:  Vitals:   10/25/20 1730  TempSrc:   PainSc: Asleep                 Octavie Westerhold DANIEL

## 2020-10-26 ENCOUNTER — Encounter (HOSPITAL_COMMUNITY): Payer: Self-pay | Admitting: General Surgery

## 2020-10-26 ENCOUNTER — Other Ambulatory Visit (HOSPITAL_COMMUNITY): Payer: Self-pay

## 2020-10-26 LAB — CBC WITH DIFFERENTIAL/PLATELET
Abs Immature Granulocytes: 0.06 10*3/uL (ref 0.00–0.07)
Basophils Absolute: 0 10*3/uL (ref 0.0–0.1)
Basophils Relative: 0 %
Eosinophils Absolute: 0 10*3/uL (ref 0.0–0.5)
Eosinophils Relative: 0 %
HCT: 37.2 % (ref 36.0–46.0)
Hemoglobin: 11.8 g/dL — ABNORMAL LOW (ref 12.0–15.0)
Immature Granulocytes: 1 %
Lymphocytes Relative: 8 %
Lymphs Abs: 1 10*3/uL (ref 0.7–4.0)
MCH: 29.9 pg (ref 26.0–34.0)
MCHC: 31.7 g/dL (ref 30.0–36.0)
MCV: 94.2 fL (ref 80.0–100.0)
Monocytes Absolute: 0.3 10*3/uL (ref 0.1–1.0)
Monocytes Relative: 3 %
Neutro Abs: 10.4 10*3/uL — ABNORMAL HIGH (ref 1.7–7.7)
Neutrophils Relative %: 88 %
Platelets: 246 10*3/uL (ref 150–400)
RBC: 3.95 MIL/uL (ref 3.87–5.11)
RDW: 13.7 % (ref 11.5–15.5)
WBC: 11.8 10*3/uL — ABNORMAL HIGH (ref 4.0–10.5)
nRBC: 0 % (ref 0.0–0.2)

## 2020-10-26 LAB — COMPREHENSIVE METABOLIC PANEL
ALT: 71 U/L — ABNORMAL HIGH (ref 0–44)
AST: 49 U/L — ABNORMAL HIGH (ref 15–41)
Albumin: 3.6 g/dL (ref 3.5–5.0)
Alkaline Phosphatase: 55 U/L (ref 38–126)
Anion gap: 6 (ref 5–15)
BUN: 14 mg/dL (ref 8–23)
CO2: 26 mmol/L (ref 22–32)
Calcium: 8.9 mg/dL (ref 8.9–10.3)
Chloride: 107 mmol/L (ref 98–111)
Creatinine, Ser: 0.61 mg/dL (ref 0.44–1.00)
GFR, Estimated: >60 mL/min (ref >60)
Glucose, Bld: 158 mg/dL — ABNORMAL HIGH (ref 70–99)
Potassium: 4.3 mmol/L (ref 3.5–5.1)
Sodium: 139 mmol/L (ref 135–145)
Total Bilirubin: 0.6 mg/dL (ref 0.3–1.2)
Total Protein: 6.7 g/dL (ref 6.5–8.1)

## 2020-10-26 LAB — CBC
HCT: 38.8 % (ref 36.0–46.0)
Hemoglobin: 12.3 g/dL (ref 12.0–15.0)
MCH: 29.9 pg (ref 26.0–34.0)
MCHC: 31.7 g/dL (ref 30.0–36.0)
MCV: 94.4 fL (ref 80.0–100.0)
Platelets: 265 10*3/uL (ref 150–400)
RBC: 4.11 MIL/uL (ref 3.87–5.11)
RDW: 14 % (ref 11.5–15.5)
WBC: 12.7 10*3/uL — ABNORMAL HIGH (ref 4.0–10.5)
nRBC: 0 % (ref 0.0–0.2)

## 2020-10-26 NOTE — Progress Notes (Signed)
Nutrition Education Note ° °Received consult for diet education for patient s/p bariatric surgery. ° °Discussed 2 week post op diet with pt. Emphasized that liquids must be non carbonated, non caffeinated, and sugar free. Fluid goals discussed. Pt to follow up with outpatient bariatric RD for further diet progression after 2 weeks. Multivitamins and minerals also reviewed. Teach back method used, pt expressed understanding, expect good compliance. ° °If nutrition issues arise, please consult RD. ° °Jaise Moser, MS, RD, LDN °Inpatient Clinical Dietitian °Contact information available via Amion ° ° °

## 2020-10-26 NOTE — Progress Notes (Signed)
Patient alert and oriented, Post op day 1.  Provided support and encouragement.  Encouraged pulmonary toilet, ambulation and small sips of liquids.  Pt also watched Lovenox self injection video.  All questions answered.  Will continue to monitor.

## 2020-10-26 NOTE — Progress Notes (Signed)
Patient ID: Brandi Henry, female   DOB: [birth year], 65 y.o.   MRN: [id number]   Progress Note: Metabolic and Bariatric Surgery Service   Chief Complaint/Subjective: Did ok with water Ambulated some Having some epigastric discomfort - fairly constant not necessarily with oral intake  Objective: Vital signs in last 24 hours: Temp:  [97.7 F (36.5 C)-98.9 F (37.2 C)] 97.7 F (36.5 C) (08/23 [id number]) Pulse Rate:  [51-78] 53 (08/23 1200) Resp:  [15-25] 18 (08/23 0632) BP: (101-153)/(47-88) 114/47 (08/23 1200) SpO2:  [95 %-100 %] 97 % (08/23 [id number])    Intake/Output from previous day: 08/22 0701 - 08/23 0700 In: 3790.3 [P.O.:320; I.V.:3370.3; IV Piggyback:100] Out: 1625 [Urine:1600; Blood:25] Intake/Output this shift: Total I/O In: 60 [P.O.:60] Out: 550 [Urine:550]  Lungs: symm rise nonlabored  Cardiovascular: reg  Abd: soft, incisions ok, expected mild TTP  Extremities: no edema  Neuro: nonfocal  Lab Results: CBC  Recent Labs    10/25/20 1643 10/26/20 0406  WBC  --  11.8*  HGB 12.2 11.8*  HCT 38.5 37.2  PLT  --  246   BMET Recent Labs    10/26/20 0406  NA 139  K 4.3  CL 107  CO2 26  GLUCOSE 158*  BUN 14  CREATININE 0.61  CALCIUM 8.9   PT/INR No results for input(s): LABPROT, INR in the last 72 hours. ABG No results for input(s): PHART, HCO3 in the last 72 hours.  Invalid input(s): PCO2, PO2  Studies/Results:  Anti-infectives: Anti-infectives (From admission, onward)    Start     Dose/Rate Route Frequency Ordered Stop   10/25/20 1345  cefoTEtan (CEFOTAN) 2 g in sodium chloride 0.9 % 100 mL IVPB        2 g 200 mL/hr over 30 Minutes Intravenous On call to O.R. 10/25/20 1324 10/25/20 1400   10/25/20 1332  sodium chloride 0.9 % with cefoTEtan (CEFOTAN) ADS Med       Note to Pharmacy: Kyra Leyland   : cabinet override      10/25/20 1332 10/25/20 1412       Medications: Scheduled Meds:  acetaminophen  1,000 mg Oral Q8H   Or    acetaminophen (TYLENOL) oral liquid 160 mg/5 mL  1,000 mg Oral Q8H   amLODipine  10 mg Oral QHS   enoxaparin (LOVENOX) injection  30 mg Subcutaneous Q12H   escitalopram  10 mg Oral Daily   gabapentin  200 mg Oral Q12H   lisinopril  40 mg Oral Daily   And   hydrochlorothiazide  25 mg Oral Daily   levothyroxine  125 mcg Oral QAC breakfast   pantoprazole (PROTONIX) IV  40 mg Intravenous QHS   Ensure Max Protein  2 oz Oral Q2H   Continuous Infusions:  dextrose 5 % and 0.45 % NaCl with KCl 20 mEq/L 125 mL/hr at 10/26/20 0436   PRN Meds:.azelastine, hydrALAZINE, morphine injection, ondansetron (ZOFRAN) IV, oxyCODONE, polyvinyl alcohol, simethicone  Assessment/Plan: Patient Active Problem List   Diagnosis Date Noted   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  Will check a PM cbc Hold lisinopril and hctz today Cont chemical vte prophy unless hgb is dropping on repeat Give norvasc  Disposition:  LOS: 1 day  The patient does not meet criteria for discharge because She did not meet oral intake goals and is at high risk of dehydration  Greer Pickerel, MD [phone] [facility]

## 2020-10-26 NOTE — Progress Notes (Signed)
Patient alert and oriented, pain is controlled. Patient is tolerating fluids, advanced to protein shake today, patient is tolerating well.  Reviewed Gastric sleeve discharge instructions with patient and patient is able to articulate understanding.  Provided information on BELT program, Support Group and [facility] outpatient pharmacy. All questions answered, will continue to monitor.  

## 2020-10-26 NOTE — Progress Notes (Signed)
First 2 ounces of water was given to patient, explained to sip water slowly. Educated patient on the use of the incentive spirometer, she demonstrated correct use.

## 2020-10-26 NOTE — Progress Notes (Signed)
Pt still feeling dizzy at this time, pain is controlled with the Oxycodone. Tolerating protein drink, working on her second container.

## 2020-10-27 ENCOUNTER — Other Ambulatory Visit (HOSPITAL_COMMUNITY): Payer: Self-pay

## 2020-10-27 LAB — CBC WITH DIFFERENTIAL/PLATELET
Abs Immature Granulocytes: 0.04 10*3/uL (ref 0.00–0.07)
Basophils Absolute: 0.1 10*3/uL (ref 0.0–0.1)
Basophils Relative: 1 %
Eosinophils Absolute: 0.2 10*3/uL (ref 0.0–0.5)
Eosinophils Relative: 2 %
HCT: 35.8 % — ABNORMAL LOW (ref 36.0–46.0)
Hemoglobin: 11 g/dL — ABNORMAL LOW (ref 12.0–15.0)
Immature Granulocytes: 0 %
Lymphocytes Relative: 31 %
Lymphs Abs: 3.4 10*3/uL (ref 0.7–4.0)
MCH: 29.5 pg (ref 26.0–34.0)
MCHC: 30.7 g/dL (ref 30.0–36.0)
MCV: 96 fL (ref 80.0–100.0)
Monocytes Absolute: 0.7 10*3/uL (ref 0.1–1.0)
Monocytes Relative: 6 %
Neutro Abs: 6.5 10*3/uL (ref 1.7–7.7)
Neutrophils Relative %: 60 %
Platelets: 244 10*3/uL (ref 150–400)
RBC: 3.73 MIL/uL — ABNORMAL LOW (ref 3.87–5.11)
RDW: 14.2 % (ref 11.5–15.5)
WBC: 10.8 10*3/uL — ABNORMAL HIGH (ref 4.0–10.5)
nRBC: 0 % (ref 0.0–0.2)

## 2020-10-27 MED ORDER — ACETAMINOPHEN 500 MG PO TABS: 1000.0000 mg | ORAL_TABLET | Freq: Three times a day (TID) | ORAL | 0 refills | Status: AC

## 2020-10-27 MED ORDER — ONDANSETRON 4 MG PO TBDP
4.0000 mg | ORAL_TABLET | Freq: Four times a day (QID) | ORAL | 0 refills | Status: DC | PRN
  Filled 2020-10-27: qty 18, 21d supply, fill #0

## 2020-10-27 MED ORDER — GABAPENTIN 100 MG PO CAPS
200.0000 mg | ORAL_CAPSULE | Freq: Two times a day (BID) | ORAL | 0 refills | Status: DC
  Filled 2020-10-27: qty 20, 5d supply, fill #0

## 2020-10-27 MED ORDER — TRAMADOL HCL 50 MG PO TABS
50.0000 mg | ORAL_TABLET | Freq: Four times a day (QID) | ORAL | 0 refills | Status: DC | PRN
  Filled 2020-10-27: qty 10, 3d supply, fill #0

## 2020-10-27 MED ORDER — PANTOPRAZOLE SODIUM 40 MG PO TBEC
40.0000 mg | DELAYED_RELEASE_TABLET | Freq: Every day | ORAL | 0 refills | Status: DC
  Filled 2020-10-27: qty 90, 90d supply, fill #0

## 2020-10-27 MED ORDER — ENOXAPARIN SODIUM 60 MG/0.6ML IJ SOSY
60.0000 mg | PREFILLED_SYRINGE | INTRAMUSCULAR | 0 refills | Status: DC
  Filled 2020-10-27: qty 16.8, 28d supply, fill #0

## 2020-10-27 NOTE — Progress Notes (Signed)
24hr fluid recall prior to discharge: 588m of protein shake and broth.  Per dehydration protocol, will call pt to f/u within one week post op.

## 2020-10-27 NOTE — TOC Benefit Eligibility Note (Signed)
Transition of Care [facility]) Benefit Eligibility Note    Patient Details  Name: Brandi Henry MRN: [id number] Date of Birth: 07/29/1955   Medication/Dose: Lovenox 60 mg SQ q 12 hr x 2 weeks  Covered?: Yes  Tier: 3 Drug  Prescription Coverage Preferred Pharmacy: local  Spoke with Person/Company/Phone Number:: Levada Dy [facility] [phone]  Co-Pay: $30.20  Prior Approval: No  Deductible: Met       Kerin Salen Phone Number: 10/27/2020, 9:58 AM

## 2020-10-27 NOTE — Progress Notes (Signed)
Discharge packet provided to pt by [place] lamb Engineer, maintenance (IT). This Probation officer educated pt on Lovenox and all questions answered. Pt family member at bedside. Pt taken downstairs via wheelchair with staff member. IV removed. All questions answered

## 2020-10-27 NOTE — Progress Notes (Signed)
PHARMACY CONSULT FOR:  Risk Assessment for Post-Discharge VTE Following Bariatric Surgery  Post-Discharge VTE Risk Assessment: This patient's probability of 30-day post-discharge VTE is increased due to the factors marked:   Female   X Age >/=60 years   X BMI >/=50 kg/m2    CHF    Dyspnea at Rest    Paraplegia   X Non-gastric-band surgery    Operation Time >/=3 hr    Return to OR     Length of Stay >/= 3 d   Hx of VTE   Hypercoagulable condition   Significant venous stasis       Predicted probability of 30-day post-discharge VTE: 0.52%   Recommendation for Discharge: Enoxaparin 60 mg [place] q12h x 2 weeks post-discharge   Brandi Henry is a 65 y.o. female who underwent sleeve gastrectomy on 10/25/20   Case start: 1415 Case end: 1554   Allergies  Allergen Reactions   Elemental Sulfur Hives    Thirty years ago   Sulfa Antibiotics Hives    Patient Measurements: Height: '5\' 2"'$  (157.5 cm) Weight: (!) 137.9 kg (304 lb) IBW/kg (Calculated) : 50.1 Body mass index is 55.6 kg/m.  Recent Labs    10/26/20 0406 10/26/20 1247 10/27/20 0440  WBC 11.8* 12.7* 10.8*  HGB 11.8* 12.3 11.0*  HCT 37.2 38.8 35.8*  PLT 246 265 244  CREATININE 0.61  --   --   ALBUMIN 3.6  --   --   PROT 6.7  --   --   AST 49*  --   --   ALT 71*  --   --   ALKPHOS 55  --   --   BILITOT 0.6  --   --    Estimated Creatinine Clearance: 96.8 mL/min (by C-G formula based on SCr of 0.61 mg/dL).    Past Medical History:  Diagnosis Date   Actinic keratosis    Arthritis    Basal cell carcinoma 05/06/2019   Left mid forearm. BCC with sclerosis. Excised: 06/30/2019, margins free   Congenital absence of one kidney    no left kidney   Dyspnea    Hypertension    Hypothyroidism    Obesity    PONV (postoperative nausea and vomiting)    Sleep apnea    wears CPAP     Medications Prior to Admission  Medication Sig Dispense Refill Last Dose   amLODipine (NORVASC) 10 MG tablet Take 10 mg by mouth at  bedtime.   10/24/2020   aspirin EC 81 MG tablet Take 81 mg by mouth in the morning.   10/19/2020   Azelastine HCl 137 MCG/SPRAY SOLN Place 1 spray into both nostrils daily as needed (sinuses).   Past Month   escitalopram (LEXAPRO) 10 MG tablet Take 10 mg by mouth in the morning.   10/25/2020 at 0830   levothyroxine (SYNTHROID) 125 MCG tablet Take 125 mcg by mouth daily before breakfast.   10/25/2020 at 0530   lisinopril-hydrochlorothiazide (PRINZIDE,ZESTORETIC) 20-12.5 MG tablet Take 2 tablets by mouth in the morning.   10/24/2020   mometasone (ELOCON) 0.1 % lotion Apply 1 application topically daily as needed (irritation).   Past Month   Multiple Vitamins-Minerals (BARIATRIC MULTIVITAMINS/IRON PO) Take 1 capsule by mouth daily.   10/24/2020   NON FORMULARY Pt uses cpap nightly   10/24/2020   pravastatin (PRAVACHOL) 40 MG tablet Take 40 mg by mouth at bedtime.   10/24/2020   hydroxypropyl methylcellulose / hypromellose (ISOPTO TEARS / GONIOVISC) 2.5 % ophthalmic  solution Place 1 drop into both eyes 3 (three) times daily as needed for dry eyes.      triamcinolone cream (KENALOG) 0.1 % Apply to rash on lower back and right foot 1-2 times a day until improved. Avoid face, groin, underarms. (Patient not taking: No sig reported) 80 g 0 Not Taking      10/27/2020 1207 - Clarified with Dr Greer Pickerel that on discharge patient will be prescribed enoxaparin '60mg'$  subq daily for one month (instead of above recommendation).  Suzzanne Cloud, PharmD, BCPS Clinical Pharmacist [place] Please utilize Amion for appropriate phone number to reach the unit pharmacist ([place]) 10/27/2020 12:09 PM

## 2020-10-27 NOTE — Progress Notes (Signed)
Mobility Specialist - Progress Note   10/27/20 1113  Mobility  Activity Ambulated in hall  Level of Assistance Contact guard assist, steadying assist  Assistive Device Other (Comment) (IV Pole)  Distance Ambulated (ft) 150 ft  Mobility Ambulated with assistance in hallway  Mobility Response Tolerated well  Mobility performed by Mobility specialist  $Mobility charge 1 Mobility   Pt did not require assistance sitting EOB or standing, but did required contact guard when ambulating and pushed IV pole for stability. Pt ambulated 150 ft in hallway and reported no pain or dizziness, but did experience SOB. 2 standing rest breaks utilized to practiced pursed breathing. Pt returned to bed after session to practice pursed breathing and was left in bed with call bell at side.   [place] Specialist Acute Rehabilitation Services Phone: [phone] 10/27/20, 11:16 AM

## 2020-10-27 NOTE — Progress Notes (Signed)
Patient alert and oriented, Post op day 2.  Provided support and encouragement.  Encouraged pulmonary toilet, ambulation and small sips of liquids.  All questions answered.  Will continue to monitor. 

## 2020-10-28 LAB — SURGICAL PATHOLOGY

## 2020-10-28 NOTE — Discharge Summary (Signed)
Physician Discharge Summary  Brandi Henry WGN:[id number] DOB: [birth year] DOA: 10/25/2020  PCP: Dion Body, MD  Admit date: 10/25/2020 Discharge date: 10/27/2020  Recommendations for Outpatient Follow-up:    Follow-up Information     Greer Pickerel, MD. Go on 11/12/2020.   Specialty: General Surgery Why: Appointment time: 330pm.  Please arrive 15 minutes prior to appointment.  Thank you. Contact information: [address] [place] [address] [phone]         Carlena Hurl, PA-C. Go on 12/21/2020.   Specialty: General Surgery Why: Appointment time: 2:15 pm for Dr. Redmond Pulling .  Please arrive 15 minutes prior to appointment.  Thank you. Contact information: [place] [address] [phone]                Discharge Diagnoses:  Active Problems:   S/P robotic sleeve gastrectomy Morbid obesity due to excess calories bmi 56  OSA on CPAP  Essential hypertension  Mixed hyperlipidemia (LDL 133 - 09/13/20)   Single kidney  Borderline diabetes mellitus (A1c 5.9% - 09/13/20) - diet controlled  Acquired hypothyroidism (TSH 3 - 09/13/20)  Surgical Procedure: robotic Sleeve Gastrectomy, upper endoscopy  Discharge Condition: Good Disposition: Home  Diet recommendation: Postoperative sleeve gastrectomy diet (liquids only)  Filed Weights   10/25/20 1203  Weight: (!) 137.9 kg     Hospital Course:  The patient was admitted for a planned robotic sleeve gastrectomy. Please see operative note. Preoperatively the patient was given 5000 units of subcutaneous heparin for DVT prophylaxis. Postoperative prophylactic Lovenox dosing was started on the evening of postoperative day 0. ERAS protocol was used. On the evening of postoperative day 0, the patient was started on water and ice chips. On postoperative day 1 the patient had no fever or tachycardia and was tolerating water in their diet was gradually advanced throughout the day. The  patient was ambulating without difficulty. Their vital signs are stable without fever or tachycardia. Their hemoglobin had remained stable. However the patient did not meet discharge criteria on postop day 1 due to some dizziness and not meeting oral intake goals.The patient was maintained on their home settings for CPAP therapy. The patient had received discharge instructions and counseling. They were deemed stable for discharge and had met discharge criteria  On postoperative day 2, the patient's dizziness had resolved.  We had held her lisinopril and HCTZ because her pulse was in the 50s and her blood pressure was in the 120s.  Her oral intake had improved and she met fluid goals and she was felt stable for discharge.  I asked the patient to hold her lisinopril and HCTZ on discharge.  I asked her to monitor her blood pressure daily.  If she started to notice that her blood pressure was increasing then she was to resume those 2 medications and/or call her PCP for instructions.  Because of her increased risk for postoperative thrombosis she was sent out on once a day Lovenox for 1 month.  She received discharge teaching on Lovenox injections  BP 120/61 (BP Location: Right Arm)   Pulse (!) 48   Temp 98.6 F (37 C) (Oral)   Resp 18   Ht _0  (1.575 m)   Wt (!) 137.9 kg   SpO2 97%   BMI 55.60 kg/m   Gen: alert, NAD, non-toxic appearing Pupils: equal, no scleral icterus Pulm: Lungs clear to auscultation, symmetric chest rise CV: regular rate and rhythm Abd: soft, mild approp tender,  nondistended.  No cellulitis. No incisional hernia Ext: no edema, no calf tenderness Skin: no rash, no jaundice   Discharge Instructions  Discharge Instructions     Ambulate hourly while awake   Complete by: As directed    Call MD for:  difficulty breathing, headache or visual disturbances   Complete by: As directed    Call MD for:  persistant dizziness or light-headedness   Complete by: As directed     Call MD for:  persistant nausea and vomiting   Complete by: As directed    Call MD for:  redness, tenderness, or signs of infection (pain, swelling, redness, odor or green/yellow discharge around incision site)   Complete by: As directed    Call MD for:  severe uncontrolled pain   Complete by: As directed    Call MD for:  temperature >101 F   Complete by: As directed    Diet bariatric full liquid   Complete by: As directed    Discharge instructions   Complete by: As directed    See bariatric discharge instructions   Incentive spirometry   Complete by: As directed    Perform hourly while awake      Allergies as of 10/27/2020       Reactions   Elemental Sulfur Hives   Thirty years ago   Sulfa Antibiotics Hives        Medication List     STOP taking these medications    aspirin EC 81 MG tablet   triamcinolone cream 0.1 % Commonly known as: KENALOG       TAKE these medications    acetaminophen 500 MG tablet Commonly known as: TYLENOL Take 2 tablets (1,000 mg total) by mouth every 8 (eight) hours for 5 days.   amLODipine 10 MG tablet Commonly known as: NORVASC Take 10 mg by mouth at bedtime. Notes to patient: Monitor Blood Pressure Daily and keep a log for primary care physician.  You may need to make changes to your medications with rapid weight loss.     Azelastine HCl 137 MCG/SPRAY Soln Place 1 spray into both nostrils daily as needed (sinuses).   BARIATRIC MULTIVITAMINS/IRON PO Take 1 capsule by mouth daily.   enoxaparin 60 MG/0.6ML injection Commonly known as: LOVENOX Inject 0.6 mLs (60 mg total) into the skin daily for 28 days.   escitalopram 10 MG tablet Commonly known as: LEXAPRO Take 10 mg by mouth in the morning.   gabapentin 100 MG capsule Commonly known as: NEURONTIN Take 2 capsules (200 mg total) by mouth every 12 (twelve) hours.   hydroxypropyl methylcellulose / hypromellose 2.5 % ophthalmic solution Commonly known as: ISOPTO TEARS /  GONIOVISC Place 1 drop into both eyes 3 (three) times daily as needed for dry eyes.   levothyroxine 125 MCG tablet Commonly known as: SYNTHROID Take 125 mcg by mouth daily before breakfast.   lisinopril-hydrochlorothiazide 20-12.5 MG tablet Commonly known as: ZESTORETIC Take 2 tablets by mouth in the morning. Notes to patient: Monitor Blood Pressure Daily and signs of dehydration. Keep a log for primary care physician.  You may need to make changes to your medications with rapid weight loss.     mometasone 0.1 % lotion Commonly known as: ELOCON Apply 1 application topically daily as needed (irritation).   NON FORMULARY Pt uses cpap nightly   ondansetron 4 MG disintegrating tablet Commonly known as: ZOFRAN-ODT Dissolve 1 tablet (4 mg total) by mouth every 6 (six) hours as needed for nausea or vomiting.  pantoprazole 40 MG tablet Commonly known as: PROTONIX Take 1 tablet (40 mg total) by mouth daily.   pravastatin 40 MG tablet Commonly known as: PRAVACHOL Take 40 mg by mouth at bedtime.   traMADol 50 MG tablet Commonly known as: ULTRAM Take 1 tablet by mouth every 6 (six) hours as needed for pain.        Follow-up Information     Greer Pickerel, MD. Go on 11/12/2020.   Specialty: General Surgery Why: Appointment time: 330pm.  Please arrive 15 minutes prior to appointment.  Thank you. Contact information: [address] [place] [address] [phone]         Carlena Hurl, PA-C. Go on 12/21/2020.   Specialty: General Surgery Why: Appointment time: 2:15 pm for Dr. Redmond Pulling .  Please arrive 15 minutes prior to appointment.  Thank you. Contact information: [address] [place]  [address] [phone]                  The results of significant diagnostics from this hospitalization (including imaging, microbiology, ancillary and laboratory) are listed below for reference.    Significant Diagnostic Studies: No results  found.  Labs: Basic Metabolic Panel: Recent Labs  Lab 10/26/20 0406  NA 139  K 4.3  CL 107  CO2 26  GLUCOSE 158*  BUN 14  CREATININE 0.61  CALCIUM 8.9   Liver Function Tests: Recent Labs  Lab 10/26/20 0406  AST 49*  ALT 71*  ALKPHOS 55  BILITOT 0.6  PROT 6.7  ALBUMIN 3.6    CBC: Recent Labs  Lab 10/25/20 1643 10/26/20 0406 10/26/20 1247 10/27/20 0440  WBC  --  11.8* 12.7* 10.8*  NEUTROABS  --  10.4*  --  6.5  HGB 12.2 11.8* 12.3 11.0*  HCT 38.5 37.2 38.8 35.8*  MCV  --  94.2 94.4 96.0  PLT  --  246 265 244    CBG: No results for input(s): GLUCAP in the last 168 hours.  Active Problems:   S/P laparoscopic sleeve gastrectomy   Time coordinating discharge: 30 min  Signed:  Gayland Curry, MD [facility], [place] [phone] 10/28/2020, 4:04 PM

## 2020-10-29 ENCOUNTER — Telehealth (HOSPITAL_COMMUNITY): Payer: Self-pay | Admitting: *Deleted

## 2020-10-29 NOTE — Telephone Encounter (Addendum)
1.  Tell me about your pain and pain management? Pt denies any pain.  2.  Let's talk about fluid intake.  How much total fluid are you taking in? Pt states that she is getting in at least 64oz of fluid including protein shakes, bottled water.  3.  How much protein have you taken in the last 2 days? Pt states she is meeting her goal of 60g of protein each day with the protein shakes.  4.  Have you had nausea?  Tell me about when have experienced nausea and what you did to help? Pt c/o nausea for the past two mornings.  Pt takes Zofran which alleviates symptoms.  Pt denies vomiting.   5.  Has the frequency or color changed with your urine? Pt states that she is urinating "fine" with no changes in frequency or urgency.     6.  Tell me what your incisions look like? "Incisions look fine". Pt denies a fever, chills.  Pt states incisions are not swollen, open, or draining.  Pt encouraged to call [facility] if incisions change.   7.  Have you been passing gas? BM? Pt states that she has not had a BM.  Pt has taken Miralax this morning. Pt to call surgeon's office if not able to have BM with medication.   8.  If a problem or question were to arise who would you call?  Do you know contact numbers for [place], [facility], and [facility]? Pt denies dehydration symptoms.  Pt can describe s/sx of dehydration.  Pt knows to call [facility] for surgical, [facility] for nutrition, and [place] for non-urgent questions or concerns.   9.  How has the walking going? Pt states she is walking around and able to be active without difficulty.   10. Are you still using your incentive spirometer?  If so, how often? Pt states that she uses it frequently, but "not enough". Pt c/o dry cough. Denies any SOB. Pt encouraged to use incentive spirometer, at least 10x every hour while awake until she sees the surgeon.  11.  How are your vitamins and calcium going?  How are you taking them? Pt states that she is taking her supplements and vitamins without  difficulty.  Pt states that she has been taking the Lovenox injections every morning without any difficulty.  Reminded patient that the first 30 days post-operatively are important for successful recovery.  Practice good hand hygiene, wearing a mask when appropriate (since optional in most places), and minimizing exposure to people who live outside of the home, especially if they are exhibiting any respiratory, GI, or illness-like symptoms.

## 2020-11-09 ENCOUNTER — Encounter: Payer: BC Managed Care – PPO | Attending: General Surgery | Admitting: Skilled Nursing Facility1

## 2020-11-09 ENCOUNTER — Other Ambulatory Visit: Payer: Self-pay

## 2020-11-09 DIAGNOSIS — Z6841 Body Mass Index (BMI) 40.0 and over, adult: Secondary | ICD-10-CM | POA: Insufficient documentation

## 2020-11-11 ENCOUNTER — Telehealth: Payer: Self-pay | Admitting: Skilled Nursing Facility1

## 2020-11-11 NOTE — Telephone Encounter (Signed)
That is okay that is what I am here for.   You can go over the 60 grams per day, if you hit your 60 grams but your still hungry you can certainlty have a snack. Choose anything on the list so some pieces of rotissiere chickn or cheese or beans etc.   Does that help?  From: Brandi Henry [email or web address]>  Sent: Thursday, November 11, 2020 3:12 PM To: Brandi Henry [email or web address]> Subject: Question?  *Caution - External email - see footer for warnings*   Hi Giulian Goldring,   Question what is a good snack?  If I'm planning my 3 meals with 20grams of protein. I should be done for the day.  I'm finding that  mid afternoon I need something?  And only doing 3 oz I'm full sometimes 2 oz.  So those times I know I can do other oz.   Sorry for so many questions.

## 2020-11-11 NOTE — Progress Notes (Signed)
2 Week Post-Operative Nutrition Class   Patient was seen on 11/09/2020 for Post-Operative Nutrition education at the [facility].    Surgery date: 10/25/2020 Surgery type: sleeve Start weight at [facility]: 324 Weight today: 297.3 pounds Bowel Habits: Every day to every other day no complaints   Body Composition Scale 11/09/2020  Current Body Weight 297.3  Total Body Fat % 51.9  Visceral Fat 27  Fat-Free Mass % 48   Total Body Water % 38.5  Muscle-Mass lbs 28.1  BMI 54.3  Body Fat Displacement          Torso  lbs 95.8         Left Leg  lbs 19.1         Right Leg  lbs 19.1         Left Arm  lbs 9.5         Right Arm   lbs 9.5      The following the learning objectives were met by the patient during this course: Identifies Phase 3 (Soft, High Proteins) Dietary Goals and will begin from 2 weeks post-operatively to 2 months post-operatively Identifies appropriate sources of fluids and proteins  Identifies appropriate fat sources and healthy verses unhealthy fat types   States protein recommendations and appropriate sources post-operatively Identifies the need for appropriate texture modifications, mastication, and bite sizes when consuming solids Identifies appropriate fat consumption and sources Identifies appropriate multivitamin and calcium sources post-operatively Describes the need for physical activity post-operatively and will follow MD recommendations States when to call healthcare provider regarding medication questions or post-operative complications   Handouts given during class include: Phase 3A: Soft, High Protein Diet Handout Phase 3 High Protein Meals Healthy Fats   Follow-Up Plan: Patient will follow-up at [facility] in 6 weeks for 2 month post-op nutrition visit for diet advancement per MD.

## 2020-11-15 ENCOUNTER — Telehealth: Payer: Self-pay | Admitting: Skilled Nursing Facility1

## 2020-11-15 NOTE — Telephone Encounter (Signed)
RD called pt to verify fluid intake once starting soft, solid proteins 2 week post-bariatric surgery.   Daily Fluid intake: 64+ Daily Protein intake: 60+ Bowel Habits: every day to other day with the use mirilax   Concerns/issues:   N/A

## 2020-12-22 ENCOUNTER — Other Ambulatory Visit: Payer: Self-pay

## 2020-12-22 ENCOUNTER — Encounter: Payer: BC Managed Care – PPO | Attending: General Surgery | Admitting: Skilled Nursing Facility1

## 2020-12-22 DIAGNOSIS — Z6841 Body Mass Index (BMI) 40.0 and over, adult: Secondary | ICD-10-CM | POA: Diagnosis present

## 2020-12-22 NOTE — Progress Notes (Signed)
Bariatric Nutrition Follow-Up Visit Medical Nutrition Therapy    NUTRITION ASSESSMENT      Surgery date: 10/25/2020 Surgery type: sleeve Start weight at [facility]: 324 Weight today: 276.3 pounds    Body Composition Scale 11/09/2020 12/22/2020  Current Body Weight 297.3 276.3  Total Body Fat % 51.9 50.6  Visceral Fat 27 25  Fat-Free Mass % 48 49.3   Total Body Water % 38.5 39.1  Muscle-Mass lbs 28.1 27.9  BMI 54.3 50.4  Body Fat Displacement           Torso  lbs 95.8 86.7         Left Leg  lbs 19.1 17.3         Right Leg  lbs 19.1 17.3         Left Arm  lbs 9.5 8.6         Right Arm   lbs 9.5 8.6   Clinical  Medical hx: prediebetes, congenital one kidney  Medications: see list Labs: A1C 6.0 Notable signs/symptoms: dry skin, itchy ears, joint pain, knee pain  Any previous deficiencies? No   Lifestyle & Dietary Hx  Pt states her energy level is getting better no longer needing naps.  Pt state she sometimes gets a little nausea with eating too quickly. Pt states she has tried edamame which she likes.  Pt state she has doubled her steps since surgery and is no longer out of breath for menial tasks.  Pt state she does sometimes have some sugar free candies.   Estimated daily fluid intake: 64-72 oz Estimated daily protein intake: 60+ g Supplements: multi and calcium  Current average weekly physical activity: working with PT for her knee so she can walk more and working on her core; doing her exercises at home outside of PT  24-Hr Dietary Recall First Meal: coffee + egg + 2 [place] sausage  Snack:   Second Meal: 3 ounces deli meat or tuna Snack:   Third Meal: 3 ounces chicken salad + low fat mayo + pickle juice  Snack:  Beverages: coffee + sugar free flavoring, vitamin water, protein water, water  Post-Op Goals/ Signs/ Symptoms Using straws: no Drinking while eating: no Chewing/swallowing difficulties: no Changes in vision: no Changes to mood/headaches: no Hair  loss/changes to skin/nails: no Difficulty focusing/concentrating: non Sweating: no Limb weakness: no Dizziness/lightheadedness: no Palpitations: no  Carbonated/caffeinated beverages: no N/V/D/C/Gas: no Abdominal pain: no Dumping syndrome: no    NUTRITION DIAGNOSIS  Overweight/obesity (-3.3) related to past poor dietary habits and physical inactivity as evidenced by completed bariatric surgery and following dietary guidelines for continued weight loss and healthy nutrition status.     NUTRITION INTERVENTION Nutrition counseling (C-1) and education (E-2) to facilitate bariatric surgery goals, including: Diet advancement to the next phase (phase 4) now including non starchy vegetables  The importance of consuming adequate calories as well as certain nutrients daily due to the body's need for essential vitamins, minerals, and fats The importance of daily physical activity and to reach a goal of at least 150 minutes of moderate to vigorous physical activity weekly (or as directed by their physician) due to benefits such as increased musculature and improved lab values The importance of intuitive eating specifically learning hunger-satiety cues and understanding the importance of learning a new body: The importance of mindful eating to avoid grazing behaviors   Goals: -Continue to aim for a minimum of 64 fluid ounces 7 days a week with at least 30 ounces being plain water  -  Eat non-starchy vegetables 2 times a day 7 days a week  -Start out with soft cooked vegetables today and tomorrow; if tolerated begin to eat raw vegetables or cooked including salads  -Eat your 3 ounces of protein first then start in on your non-starchy vegetables; once you understand how much of your meal leads to satisfaction and not full while still eating 3 ounces of protein and non-starchy vegetables you can eat them in any order   -Continue to aim for 30 minutes of activity at least 5 times a week  -Do NOT cook  with/add to your food: alfredo sauce, cheese sauce, barbeque sauce, ketchup, fat back, butter, bacon grease, grease, Crisco, OR SUGAR   Handouts Provided Include  Phase 4  Learning Style & Readiness for Change Teaching method utilized: Visual & Auditory  Demonstrated degree of understanding via: Teach Back  Readiness Level: action Barriers to learning/adherence to lifestyle change: none identified   RD's Notes for Next Visit Assess adherence to pt chosen goals    MONITORING & EVALUATION Dietary intake, weekly physical activity, body weight  Next Steps Patient is to follow-up in January

## 2021-03-15 ENCOUNTER — Other Ambulatory Visit: Payer: Self-pay

## 2021-03-15 ENCOUNTER — Encounter: Payer: BC Managed Care – PPO | Attending: General Surgery | Admitting: Skilled Nursing Facility1

## 2021-03-15 DIAGNOSIS — Z6841 Body Mass Index (BMI) 40.0 and over, adult: Secondary | ICD-10-CM | POA: Diagnosis present

## 2021-03-16 NOTE — Progress Notes (Signed)
Follow-up visit:  Post-Operative sleeve Surgery  Medical Nutrition Therapy:  Appt start time: 6:00pm end time:  7:00pm  Primary concerns today: Post-operative Bariatric Surgery Nutrition Management 6 Month Post-Op Class  Surgery date: 10/25/2020 Surgery type: sleeve Start weight at [facility]: 324 Weight today: 251.1 pounds    Body Composition Scale 11/09/2020 12/22/2020 03/16/2021  Current Body Weight 297.3 276.3 251.1  Total Body Fat % 51.9 50.6 48.7  Visceral Fat 27 25 22   Fat-Free Mass % 48 49.3 51.2   Total Body Water % 38.5 39.1 40.1  Muscle-Mass lbs 28.1 27.9 27.7  BMI 54.3 50.4 45.8  Body Fat Displacement            Torso  lbs 95.8 86.7 75.9         Left Leg  lbs 19.1 17.3 15.1         Right Leg  lbs 19.1 17.3 15.1         Left Arm  lbs 9.5 8.6 7.5         Right Arm   lbs 9.5 8.6 7.5   Clinical  Medical hx: prediebetes, congenital one kidney  Medications: see list Labs: A1C 6.0 Notable signs/symptoms: dry skin, itchy ears, joint pain, knee pain  Any previous deficiencies? No    Information Reviewed/ Discussed During Appointment: -Review of composition scale numbers -Fluid requirements (64-100 ounces) -Protein requirements (60-80g) -Strategies for tolerating diet -Advancement of diet to include Starchy vegetables -Barriers to inclusion of new foods -Inclusion of appropriate multivitamin and calcium supplements  -Exercise recommendations   Fluid intake: adequate   Medications: See List Supplementation: appropriate    Using straws: no Drinking while eating: no Having you been chewing well: yes Chewing/swallowing difficulties: no Changes in vision: no Changes to mood/headaches: no Hair loss/Cahnges to skin/Changes to nails: no Any difficulty focusing or concentrating: no Sweating: no Dizziness/Lightheaded: no Palpitations: no  Carbonated beverages: no N/V/D/C/GAS: no Abdominal Pain: no Dumping syndrome: no  Recent physical activity:  ADL's  Progress  Towards Goal(s):  In Progress Teaching method utilized: Visual & Auditory  Demonstrated degree of understanding via: Teach Back  Readiness Level: Action Barriers to learning/adherence to lifestyle change: none identified  Handouts given during visit include: Phase V diet Progression  Goals Sheet The Benefits of Exercise are endless..... Support Group Topics   Teaching Method Utilized:  Visual Auditory Hands on  Demonstrated degree of understanding via:  Teach Back   Monitoring/Evaluation:  Dietary intake, exercise, and body weight. Follow up in 3 months for 9 month post-op visit.

## 2021-04-19 ENCOUNTER — Other Ambulatory Visit: Payer: Self-pay | Admitting: Family Medicine

## 2021-04-19 DIAGNOSIS — Z1231 Encounter for screening mammogram for malignant neoplasm of breast: Secondary | ICD-10-CM

## 2021-06-13 ENCOUNTER — Encounter: Payer: BC Managed Care – PPO | Attending: General Surgery | Admitting: Skilled Nursing Facility1

## 2021-06-13 DIAGNOSIS — M25561 Pain in right knee: Secondary | ICD-10-CM | POA: Diagnosis not present

## 2021-06-13 DIAGNOSIS — Z713 Dietary counseling and surveillance: Secondary | ICD-10-CM | POA: Insufficient documentation

## 2021-06-13 DIAGNOSIS — I10 Essential (primary) hypertension: Secondary | ICD-10-CM | POA: Diagnosis not present

## 2021-06-13 DIAGNOSIS — E079 Disorder of thyroid, unspecified: Secondary | ICD-10-CM | POA: Insufficient documentation

## 2021-06-13 DIAGNOSIS — G8929 Other chronic pain: Secondary | ICD-10-CM | POA: Insufficient documentation

## 2021-06-13 DIAGNOSIS — M25562 Pain in left knee: Secondary | ICD-10-CM | POA: Insufficient documentation

## 2021-06-13 DIAGNOSIS — Z6841 Body Mass Index (BMI) 40.0 and over, adult: Secondary | ICD-10-CM | POA: Diagnosis not present

## 2021-06-13 DIAGNOSIS — R7303 Prediabetes: Secondary | ICD-10-CM | POA: Diagnosis not present

## 2021-06-13 DIAGNOSIS — G4733 Obstructive sleep apnea (adult) (pediatric): Secondary | ICD-10-CM | POA: Insufficient documentation

## 2021-06-13 NOTE — Progress Notes (Signed)
Follow-up visit:  Post-Operative sleeve Surgery ? ? ?Surgery date: 10/25/2020 ?Surgery type: sleeve ?Start weight at [facility]: 324 ?Weight today: 238.6 pounds ? ?  ?Body Composition Scale 11/09/2020 12/22/2020 03/16/2021 06/13/2021  ?Current Body Weight 297.3 276.3 251.1 238.6  ?Total Body Fat % 51.9 50.6 48.7 47.8  ?Visceral Fat '27 25 22 20  '$ ?Fat-Free Mass % 48 49.3 51.2 52.1  ? Total Body Water % 38.5 39.1 40.1 40.5  ?Muscle-Mass lbs 28.1 27.9 27.7 27.4  ?BMI 54.3 50.4 45.8 43.5  ?Body Fat Displacement      ?       Torso  lbs 95.8 86.7 75.9 70.6  ?       Left Leg  lbs 19.1 17.3 15.1 14.1  ?       Right Leg  lbs 19.1 17.3 15.1 14.1  ?       Left Arm  lbs 9.5 8.6 7.5 7.0  ?       Right Arm   lbs 9.5 8.6 7.5 7.0  ? ?Clinical  ?Medical hx: prediebetes, congenital one kidney  ?Medications: see list ?Labs: A1C 6.0 ?Notable signs/symptoms: dry skin, itchy ears, joint pain, knee pain  ?Any previous deficiencies? No ? ? ?Pt states she walked [place] and is so proud to not need a scooter.  ?Pt states she can tell when she does not drink enough water because she gets a little constipated.  ?Pt states she used to salt her celery but no longer practices this behavior.  ?Pt states she has realized she needs to eat and then wait 15 minutes to take her multi and then it works well.  ?Pt states she could eat grits every day. Pt states she can eat cherries, strawberries, peas, grits, cantaloupe to meet carbohydrate needs.  ? ?24 hr recall: ?First meal: 2 [place] bacon and 1 scramble egg ?Snack: celery sometimes with salsa ?Second meal: romaine, bittersweet, ice burg, celery, broccoli, cauliflower, cucumber, cheese, dressing, [place] or chicken or ham or deli meat and cheese + celery  ?Snack:  ?Third meal: chicken or seafood + green beans or broccoli (once a week half baked potato) ? ?Beverages: water, decaf coffee + skim milk + skinny syrup ? ? ?Fluid intake: adequate  ? ?Medications: See List ?Supplementation: appropriate  ? ? ?Using  straws: no ?Drinking while eating: no ?Having you been chewing well: yes ?Chewing/swallowing difficulties: no ?Changes in vision: no ?Changes to mood/headaches: no ?Hair loss/Cahnges to skin/Changes to nails: no ?Any difficulty focusing or concentrating: no ?Sweating: no ?Dizziness/Lightheaded: no ?Palpitations: no  ?Carbonated beverages: no ?N/V/D/C/GAS: no ?Abdominal Pain: no ?Dumping syndrome: no ? ?Recent physical activity:  Gym 3 times a week working with a trainer 30 minutes at a time: PT 2 times a week ? ?Progress Towards Goal(s):  In Progress ?Teaching method utilized: Visual & Auditory  ?Demonstrated degree of understanding via: Teach Back  ?Readiness Level: Action ?Barriers to learning/adherence to lifestyle change: none identified ? ?Why you need complex carbohydrates: Whole grains and other complex carbohydrates are required to have a healthy diet. Whole grains provide fiber which can help with blood glucose levels and help keep you satiated. Fruits and starchy vegetables provide essential vitamins and minerals required for immune function, eyesight support, brain support, bone density, wound healing and many other functions within the body. According to the current evidenced based 2020-2025 Dietary Guidelines for Americans, complex carbohydrates are part of a healthy eating pattern which is associated with a decreased risk for type 2 diabetes,  cancers, and cardiovascular disease.   ? ?Goals: ?Grits in the morning and one piece of fruit in the afternoon ? ? ?Teaching Method Utilized:  ?Visual ?Auditory ?Hands on ? ?Demonstrated degree of understanding via:  Teach Back  ? ?Monitoring/Evaluation:  Dietary intake, exercise, and body weight.  ? ?Pt to return in August for one year post op visit ? ?

## 2021-06-14 ENCOUNTER — Ambulatory Visit
Admission: RE | Admit: 2021-06-14 | Discharge: 2021-06-14 | Disposition: A | Discharge status: Home / Self Care | Payer: BC Managed Care – PPO | Source: Ambulatory Visit | Attending: Family Medicine | Admitting: Family Medicine

## 2021-06-14 DIAGNOSIS — Z1231 Encounter for screening mammogram for malignant neoplasm of breast: Secondary | ICD-10-CM | POA: Insufficient documentation

## 2021-07-19 ENCOUNTER — Ambulatory Visit: Payer: BC Managed Care – PPO | Admitting: Dermatology

## 2021-07-19 DIAGNOSIS — Z1283 Encounter for screening for malignant neoplasm of skin: Secondary | ICD-10-CM | POA: Diagnosis not present

## 2021-07-19 DIAGNOSIS — D2272 Melanocytic nevi of left lower limb, including hip: Secondary | ICD-10-CM

## 2021-07-19 DIAGNOSIS — Q828 Other specified congenital malformations of skin: Secondary | ICD-10-CM

## 2021-07-19 DIAGNOSIS — D229 Melanocytic nevi, unspecified: Secondary | ICD-10-CM

## 2021-07-19 DIAGNOSIS — L814 Other melanin hyperpigmentation: Secondary | ICD-10-CM

## 2021-07-19 DIAGNOSIS — Z85828 Personal history of other malignant neoplasm of skin: Secondary | ICD-10-CM

## 2021-07-19 DIAGNOSIS — L821 Other seborrheic keratosis: Secondary | ICD-10-CM

## 2021-07-19 DIAGNOSIS — L820 Inflamed seborrheic keratosis: Secondary | ICD-10-CM | POA: Diagnosis not present

## 2021-07-19 DIAGNOSIS — D1800 Hemangioma unspecified site: Secondary | ICD-10-CM

## 2021-07-19 DIAGNOSIS — L719 Rosacea, unspecified: Secondary | ICD-10-CM | POA: Diagnosis not present

## 2021-07-19 DIAGNOSIS — L918 Other hypertrophic disorders of the skin: Secondary | ICD-10-CM

## 2021-07-19 DIAGNOSIS — L578 Other skin changes due to chronic exposure to nonionizing radiation: Secondary | ICD-10-CM | POA: Diagnosis not present

## 2021-07-19 DIAGNOSIS — D2239 Melanocytic nevi of other parts of face: Secondary | ICD-10-CM

## 2021-07-19 MED ORDER — METRONIDAZOLE 0.75 % EX CREA: TOPICAL_CREAM | CUTANEOUS | 3 refills | Status: DC

## 2021-07-19 NOTE — Progress Notes (Signed)
? ?Follow-Up Visit ?  ?Subjective  ?Brandi Henry is a 66 y.o. female who presents for the following: Annual Exam. ? ?The patient presents for Total-Body Skin Exam (TBSE) for skin cancer screening and mole check.  The patient has spots, moles and lesions to be evaluated, some may be new or changing. 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 She would like a new prescription for mometasone lotion for scattered irritations she gets on body.  ? ?The following portions of the chart were reviewed this encounter and updated as appropriate:  ?  ?  ? ?Review of Systems:  No other skin or systemic complaints except as noted in HPI or Assessment and Plan. ? ?Objective  ?Well appearing patient in no apparent distress; mood and affect are within normal limits. ? ?A full examination was performed including scalp, head, eyes, ears, nose, lips, neck, chest, axillae, abdomen, back, buttocks, bilateral upper extremities, bilateral lower extremities, hands, feet, fingers, toes, fingernails, and toenails. All findings within normal limits unless otherwise noted below. ? ?R lower calf ?1.2 cm pink scaly patch with keratotic rim ? ?L hip ?6.0 x 4.0 mm med speckled brown macule ? ?R medial cheek ?4.34m firm pink flesh papule ? ?face ?Erythema on the cheeks, nose ? ?R clavicle x 1, L neck x 9 (10) ?Erythematous stuck-on, waxy papule on the left clavicle; tiny waxy pedunculated papules on the left neck  ? ? ? ?Assessment & Plan  ?Skin cancer screening performed today. ? ?Actinic Damage ?- chronic, secondary to cumulative UV radiation exposure/sun exposure over time ?- diffuse scaly erythematous macules with underlying dyspigmentation ?- Recommend daily broad spectrum sunscreen SPF 30+ to sun-exposed areas, reapply every 2 hours as needed.  ?- Recommend staying in the shade or wearing long sleeves, sun glasses (UVA+UVB protection) and wide brim hats (4-inch brim around the entire circumference of the hat). ?- Call for new  or changing lesions. ? ?Lentigines ?- Scattered tan macules ?- Due to sun exposure ?- Benign-appering, observe ?- Recommend daily broad spectrum sunscreen SPF 30+ to sun-exposed areas, reapply every 2 hours as needed. ?- Call for any changes ? ?Seborrheic Keratoses ?- Stuck-on, waxy, tan-brown papules and/or plaques  ?- Benign-appearing ?- Discussed benign etiology and prognosis. ?- Observe ?- Call for any changes ? ?Melanocytic Nevi ?- Tan-brown and/or pink-flesh-colored symmetric macules and papules ?- Benign appearing on exam today ?- Observation ?- Call clinic for new or changing moles ?- Recommend daily use of broad spectrum spf 30+ sunscreen to sun-exposed areas.  ? ?History of Basal Cell Carcinoma of the Skin ?- No evidence of recurrence today of the left mid forearm ?- Recommend regular full body skin exams ?- Recommend daily broad spectrum sunscreen SPF 30+ to sun-exposed areas, reapply every 2 hours as needed.  ?- Call if any new or changing lesions are noted between office visits ? ?Hemangiomas ?- Red papules ?- Discussed benign nature ?- Observe ?- Call for any changes ? ?Acrochordons (Skin Tags) ?- Fleshy, skin-colored pedunculated papules ?- Benign appearing.  ?- Observe. ?- If desired, they can be removed with an in office procedure that is not covered by insurance. ?- Please call the clinic if you notice any new or changing lesions. ? ?Porokeratosis ?R lower calf ? ?Benign, observe.  ? ? ?Nevus (2) ?L hip; R medial cheek ? ?Benign-appearing.  Observation.  Call clinic for new or changing moles.  Recommend daily use of broad spectrum spf 30+ sunscreen to  sun-exposed areas.  ? ?Rosacea ?face ? ?Chronic and persistent condition with duration or expected duration over one year. Condition is bothersome/symptomatic for patient. Currently flared.  ? ?Rosacea is a chronic progressive skin condition usually affecting the face of adults, causing redness and/or acne bumps. It is treatable but not curable. It  sometimes affects the eyes (ocular rosacea) as well. It may respond to topical and/or systemic medication and can flare with stress, sun exposure, alcohol, exercise and some foods.  Daily application of broad spectrum spf 30+ sunscreen to face is recommended to reduce flares. ? ?Start metronidazole 0.75% cream Apply to face qd/bid dsp 45g 3Rf.  ? ? ?metroNIDAZOLE (METROCREAM) 0.75 % cream - face ?Apply to face once to twice daily for rosacea. ? ?Inflamed seborrheic keratosis (10) ?R clavicle x 1, L neck x 9 ? ?Continue mometasone lotion qd/bid to itchy spots body prn ? ?Destruction of lesion - R clavicle x 1, L neck x 9 ? ?Destruction method: cryotherapy   ?Informed consent: discussed and consent obtained   ?Lesion destroyed using liquid nitrogen: Yes   ?Region frozen until ice ball extended beyond lesion: Yes   ?Outcome: patient tolerated procedure well with no complications   ?Post-procedure details: wound care instructions given   ?Additional details:  Prior to procedure, discussed risks of blister formation, small wound, skin dyspigmentation, or rare scar following cryotherapy. Recommend Vaseline ointment to treated areas while healing. ? ? ? ?Return in about 1 year (around 07/20/2022) for TBSE, Hx BCC. ? ?I, Jamesetta Orleans, CMA, am acting as scribe for [name], MD . ?Documentation: I have reviewed the above documentation for accuracy and completeness, and I agree with the above. ? ?[name] MD  ? ? ?

## 2021-07-19 NOTE — Patient Instructions (Addendum)
Cryotherapy Aftercare ? ?Wash gently with soap and water everyday.   ?Apply Vaseline and Band-Aid daily until healed.  ? ?Start metronidazole 0.75% cream - apply to face once a day, twice a day with flares.  ? ?Rosacea ? ?What is rosacea? ?Rosacea (say: ro-zay-sha) is a common skin disease that usually begins as a trend of flushing or blushing easily.  As rosacea progresses, a persistent redness in the center of the face will develop and may gradually spread beyond the nose and cheeks to the forehead and chin.  In some cases, the ears, chest, and back could be affected.  Rosacea may appear as tiny blood vessels or small red bumps that occur in crops.  Frequently they can contain pus, and are called ?pustules?.  If the bumps do not contain pus, they are referred to as ?papules?.  Rarely, in prolonged, untreated cases of rosacea, the oil glands of the nose and cheeks may become permanently enlarged.  This is called rhinophyma, and is seen more frequently in men. ? ?Signs and Risks ?In its beginning stages, rosacea tends to come and go, which makes it difficult to recognize.  It can start as intermittent flushing of the face.  Eventually, blood vessels may become permanently visible.  Pustules and papules can appear, but can be mistaken for adult acne.  People of all races, ages, genders and ethnic groups are at risk of developing rosacea.  However, it is more common in women (especially around menopause) and adults with fair skin between the ages of 69 and 105. ? ?Treatment ?Dermatologists typically recommend a combination of treatments to effectively manage rosacea.  Treatment can improve symptoms and may stop the progression of the rosacea.  Treatment may involve both topical and oral medications.  The tetracycline antibiotics are often used for their anti-inflammatory effect; however, because of the possibility of developing antibiotic resistance, they should not be used long term at full dose.  For dilated blood  vessels the options include electrodessication (uses electric current through a small needle), laser treatment, and cosmetics to hide the redness.   ?With all forms of treatment, improvement is a slow process, and patients may not see any results for the first 3-4 weeks.  It is very important to avoid the sun and other triggers.  Patients must wear sunscreen daily. ? ?Skin Care Instructions: ?Cleanse the skin with a mild soap such as CeraVe cleanser, Cetaphil cleanser, or Dove soap once or twice daily as needed. ?Moisturize with Eucerin Redness Relief Daily Perfecting Lotion (has a subtle green tint), CeraVe Moisturizing Cream, or Oil of Olay Daily Moisturizer with sunscreen every morning and/or night as recommended. ?Makeup should be ?non-comedogenic? (won?t clog pores) and be labeled ?for sensitive skin?Kermit Balo choices for cosmetics are: Neutrogena, Almay, and Physician?s Formula.  Any product with a green tint tends to offset a red complexion. ?If your eyes are dry and irritated, use artificial tears 2-3 times per day and cleanse the eyelids daily with baby shampoo.  Have your eyes examined at least every 2 years.  Be sure to tell your eye doctor that you have rosacea. ?Alcoholic beverages tend to cause flushing of the skin, and may make rosacea worse. ?Always wear sunscreen, protect your skin from extreme hot and cold temperatures, and avoid spicy foods, hot drinks, and mechanical irritation such as rubbing, scrubbing, or massaging the face.  Avoid harsh skin cleansers, cleansing masks, astringents, and exfoliation. If a particular product burns or makes your face feel tight, then it  is likely to flare your rosacea. ?If you are having difficulty finding a sunscreen that you can tolerate, you may try switching to a chemical-free sunscreen.  These are ones whose active ingredient is zinc oxide or titanium dioxide only.  They should also be fragrance free, non-comedogenic, and labeled for sensitive skin. ?Rosacea  triggers may vary from person to person.  There are a variety of foods that have been reported to trigger rosacea.  Some patients find that keeping a diary of what they were doing when they flared helps them avoid triggers. ? ?Seborrheic Keratosis ? ?What causes seborrheic keratoses? ?Seborrheic keratoses are harmless, common skin growths that first appear during adult life.  As time goes by, more growths appear.  Some people may develop a large number of them.  Seborrheic keratoses appear on both covered and uncovered body parts.  They are not caused by sunlight.  The tendency to develop seborrheic keratoses can be inherited.  They vary in color from skin-colored to gray, brown, or even black.  They can be either smooth or have a rough, warty surface.   ?Seborrheic keratoses are superficial and look as if they were stuck on the skin.  Under the microscope this type of keratosis looks like layers upon layers of skin.  That is why at times the top layer may seem to fall off, but the rest of the growth remains and re-grows.   ? ?Treatment ?Seborrheic keratoses do not need to be treated, but can easily be removed in the office.  Seborrheic keratoses often cause symptoms when they rub on clothing or jewelry.  Lesions can be in the way of shaving.  If they become inflamed, they can cause itching, soreness, or burning.  Removal of a seborrheic keratosis can be accomplished by freezing, burning, or surgery. ?If any spot bleeds, scabs, or grows rapidly, please return to have it checked, as these can be an indication of a skin cancer. ? ?If You Need Anything After Your Visit ? ?If you have any questions or concerns for your doctor, please call our main line at [phone] and press option 4 to reach your doctor's medical assistant. If no one answers, please leave a voicemail as directed and we will return your call as soon as possible. Messages left after 4 pm will be answered the following business day.  ? ?You may also  send [place] a message via MyChart. We typically respond to MyChart messages within 1-2 business days. ? ?For prescription refills, please ask your pharmacy to contact our office. Our fax number is [phone]. ? ?If you have an urgent issue when the clinic is closed that cannot wait until the next business day, you can page your doctor at the number below.   ? ?Please note that while we do our best to be available for urgent issues outside of office hours, we are not available 24/7.  ? ?If you have an urgent issue and are unable to reach [place], you may choose to seek medical care at your doctor's office, retail clinic, urgent care center, or emergency room. ? ?If you have a medical emergency, please immediately call 911 or go to the emergency department. ? ?Pager Numbers ? ?- Dr. Nehemiah Massed: [phone] ? ?- Dr. Laurence Ferrari: [phone] ? ?- Dr. Nicole Kindred: [phone] ? ?In the event of inclement weather, please call our main line at [phone] for an update on the status of any delays or closures. ? ?Dermatology Medication Tips: ?Please keep the boxes that topical medications  come in in order to help keep track of the instructions about where and how to use these. Pharmacies typically print the medication instructions only on the boxes and not directly on the medication tubes.  ? ?If your medication is too expensive, please contact our office at [phone] option 4 or send [place] a message through [place].  ? ?We are unable to tell what your co-pay for medications will be in advance as this is different depending on your insurance coverage. However, we may be able to find a substitute medication at lower cost or fill out paperwork to get insurance to cover a needed medication.  ? ?If a prior authorization is required to get your medication covered by your insurance company, please allow [place] 1-2 business days to complete this process. ? ?Drug prices often vary depending on where the prescription is filled and some pharmacies may  offer cheaper prices. ? ?The website [email or web address] contains coupons for medications through different pharmacies. The prices here do not account for what the cost may be with help from insurance (it may be

## 2021-07-20 ENCOUNTER — Other Ambulatory Visit: Payer: Self-pay

## 2021-07-20 MED ORDER — MOMETASONE FUROATE 0.1 % EX SOLN: CUTANEOUS | 2 refills | Status: AC

## 2021-07-25 ENCOUNTER — Encounter: Payer: Self-pay | Admitting: Dermatology

## 2021-07-26 MED ORDER — TRIAMCINOLONE ACETONIDE 0.1 % EX CREA: TOPICAL_CREAM | CUTANEOUS | 0 refills | Status: AC

## 2021-10-11 ENCOUNTER — Encounter: Payer: BC Managed Care – PPO | Attending: Family Medicine | Admitting: Skilled Nursing Facility1

## 2021-10-11 DIAGNOSIS — E669 Obesity, unspecified: Secondary | ICD-10-CM | POA: Diagnosis present

## 2021-10-11 NOTE — Progress Notes (Signed)
Follow-up visit:  Post-Operative sleeve Surgery   Surgery date: 10/25/2020 Surgery type: sleeve Start weight at [facility]: 324 Weight today: 235.8 pounds    Body Composition Scale 11/09/2020 12/22/2020 03/16/2021 06/13/2021 10/11/2021  Current Body Weight 297.3 276.3 251.1 238.6 235.8  Total Body Fat % 51.9 50.6 48.7 47.8 47.5  Visceral Fat '27 25 22 20 20  '$ Fat-Free Mass % 48 49.3 51.2 52.1 52.4   Total Body Water % 38.5 39.1 40.1 40.5 40.7  Muscle-Mass lbs 28.1 27.9 27.7 27.4 27.4  BMI 54.3 50.4 45.8 43.5 43  Body Fat Displacement              Torso  lbs 95.8 86.7 75.9 70.6 69.5         Left Leg  lbs 19.1 17.3 15.1 14.1 13.9         Right Leg  lbs 19.1 17.3 15.1 14.1 13.9         Left Arm  lbs 9.5 8.6 7.5 7.0 6.9         Right Arm   lbs 9.5 8.6 7.5 7.0 6.9   Clinical  Medical hx: prediebetes, congenital one kidney  Medications: off all medications other than thyroid Labs: A1C 5.4 Notable signs/symptoms: dry skin, itchy ears, joint pain, knee pain  Any previous deficiencies? No   Pt states she has been having tingling feet for the last 6 months having spoken with her doctor about this.   Pt states she has not been eating any carbohydrates other than the random cherry due to fear of weight gain.   24 hr recall: First meal: 2 [place] bacon or sausage and 1 scramble egg Snack: sometimes cherries  Second meal: romaine, bittersweet, ice burg, celery, broccoli, cauliflower, cucumber, cheese, dressing, [place] or chicken or ham or deli meat and cheese + celery or deli meat and cheese and celery or cucumber Snack:  Third meal: chicken + salad + peas or broccoli   Beverages: water, decaf coffee + skim milk + skinny syrup   Fluid intake: adequate   Medications: See List Supplementation: multi and calcium   Using straws: no Drinking while eating: no Having you been chewing well: yes Chewing/swallowing difficulties: no Changes in vision: no Changes to mood/headaches: no Hair  loss/Cahnges to skin/Changes to nails: no Any difficulty focusing or concentrating: no Sweating: no Dizziness/Lightheaded: no Palpitations: no  Carbonated beverages: no N/V/D/C/GAS: no Abdominal Pain: no Dumping syndrome: no  Recent physical activity:  Gym 3 times a week working with a trainer 30-60 minutes at a time: PT 2 times a week  Progress Towards Goal(s):  In Progress Teaching method utilized: Visual & Auditory  Demonstrated degree of understanding via: Teach Back  Readiness Level: Action Barriers to learning/adherence to lifestyle change: none identified  Why you need complex carbohydrates: Whole grains and other complex carbohydrates are required to have a healthy diet. Whole grains provide fiber which can help with blood glucose levels and help keep you satiated. Fruits and starchy vegetables provide essential vitamins and minerals required for immune function, eyesight support, brain support, bone density, wound healing and many other functions within the body. According to the current evidenced based 2020-2025 Dietary Guidelines for Americans, complex carbohydrates are part of a healthy eating pattern which is associated with a decreased risk for type 2 diabetes, cancers, and cardiovascular disease.    Goals: Grits in the morning and one piece of fruit in the afternoon   Teaching Method Utilized:  Visual Auditory Hands on  Demonstrated degree of understanding via:  Teach Back   Monitoring/Evaluation:  Dietary intake, exercise, and body weight.   Follow up: 3 months

## 2022-01-17 ENCOUNTER — Encounter: Payer: Self-pay | Admitting: Skilled Nursing Facility1

## 2022-01-17 ENCOUNTER — Encounter: Payer: BC Managed Care – PPO | Attending: General Surgery | Admitting: Skilled Nursing Facility1

## 2022-01-17 VITALS — Ht 62.0 in | Wt 236.0 lb

## 2022-01-17 DIAGNOSIS — E669 Obesity, unspecified: Secondary | ICD-10-CM

## 2022-01-17 NOTE — Progress Notes (Signed)
Follow-up visit:  Post-Operative sleeve Surgery   Surgery date: 10/25/2020 Surgery type: sleeve Start weight at [facility]: 324 Weight today: 236 pounds    Body Composition Scale 12/22/2020 03/16/2021 06/13/2021 10/11/2021 01/17/2022  Current Body Weight 276.3 251.1 238.6 235.8 236  Total Body Fat % 50.6 48.7 47.8 47.5 47.5  Visceral Fat '25 22 20 20 20  '$ Fat-Free Mass % 49.3 51.2 52.1 52.4 52.4   Total Body Water % 39.1 40.1 40.5 40.7 40.7  Muscle-Mass lbs 27.9 27.7 27.4 27.4 27.4  BMI 50.4 45.8 43.5 43 43  Body Fat Displacement              Torso  lbs 86.7 75.9 70.6 69.5 69.5         Left Leg  lbs 17.3 15.1 14.1 13.9 13.9         Right Leg  lbs 17.3 15.1 14.1 13.9 13.9         Left Arm  lbs 8.6 7.5 7.0 6.9 6.9         Right Arm   lbs 8.6 7.5 7.0 6.9 6.9   Clinical  Medical hx: prediebetes, congenital one kidney  Medications: off all medications other than thyroid Labs: A1C 5.4 Notable signs/symptoms: dry skin, itchy ears, joint pain, knee pain  Any previous deficiencies? No  Pt states from her activity and working with PT she has been able to do the things she needs to do easily and without stress which has been extremely exciting.  Pt states she has been forgetting her thyroid medicine here and there and when she does forget it she has significantly more energy so plans on discussing this with her doctor.     24 hr recall: First meal: 2 [place] bacon or 2 scramble egg or wheat tortilla with cheese and ham Snack:  Second meal: romaine, bittersweet, ice burg, celery, broccoli, cauliflower, cucumber, cheese, dressing, [place] or chicken or ham or deli meat and cheese + celery or deli meat and cheese and celery or cucumber Snack: fruit smoothie: berry, banana, cherries, almond milk Third meal: chicken or salad + salad or broccoli   Beverages: water, decaf coffee + skim milk + skinny syrup   Fluid intake: 60-100 ounces   Medications: See List Supplementation: multi and  calcium   Using straws: no Drinking while eating: no Having you been chewing well: yes Chewing/swallowing difficulties: no Changes in vision: no Changes to mood/headaches: no Hair loss/Cahnges to skin/Changes to nails: no Any difficulty focusing or concentrating: no Sweating: no Dizziness/Lightheaded: no Palpitations: no  Carbonated beverages: no N/V/D/C/GAS: no Abdominal Pain: no Dumping syndrome: no  Recent physical activity:  Gym 3 times a week working with a trainer 30-60 minutes at a time: PT 2 times a week  Progress Towards Goal(s):  In Progress Teaching method utilized: Visual & Auditory  Demonstrated degree of understanding via: Teach Back  Readiness Level: Action Barriers to learning/adherence to lifestyle change: none identified  Why you need complex carbohydrates: Whole grains and other complex carbohydrates are required to have a healthy diet. Whole grains provide fiber which can help with blood glucose levels and help keep you satiated. Fruits and starchy vegetables provide essential vitamins and minerals required for immune function, eyesight support, brain support, bone density, wound healing and many other functions within the body. According to the current evidenced based 2020-2025 Dietary Guidelines for Americans, complex carbohydrates are part of a healthy eating pattern which is associated with a decreased risk for type 2 diabetes,  cancers, and cardiovascular disease.   Encouraged patient to honor their body's internal hunger and fullness cues.  Throughout the day, check in mentally and rate hunger. Stop eating when satisfied not full regardless of how much food is left on the plate.  Get more if still hungry 20-30 minutes later.  The key is to honor satisfaction so throughout the meal, rate fullness factor and stop when comfortably satisfied not physically full. The key is to honor hunger and fullness without any feelings of guilt or shame.  Pay attention to what the  internal cues are, rather than any external factors. This will enhance the confidence you have in listening to your own body and following those internal cues enabling you to increase how often you eat when you are hungry not out of appetite and stop when you are satisfied not full.  Encouraged pt to continue to eat balanced meals inclusive of non starchy vegetables 2 times a day 7 days a week Encouraged pt to choose lean protein sources: limiting beef, pork, sausage, hotdogs, and lunch meat Encourage pt to choose healthy fats such as plant based limiting animal fats Encouraged pt to continue to drink a minium 64 fluid ounces with half being plain water to satisfy proper hydration    Goals: Work on not making food decisions based in Nature conservation officer Method Utilized:  Ship broker Hands on  Demonstrated degree of understanding via:  Teach Back   Monitoring/Evaluation:  Dietary intake, exercise, and body weight.   Follow up: 6 months

## 2022-02-21 ENCOUNTER — Ambulatory Visit (INDEPENDENT_AMBULATORY_CARE_PROVIDER_SITE_OTHER): Payer: BC Managed Care – PPO

## 2022-02-21 ENCOUNTER — Ambulatory Visit: Payer: BC Managed Care – PPO | Admitting: Podiatry

## 2022-02-21 DIAGNOSIS — Q666 Other congenital valgus deformities of feet: Secondary | ICD-10-CM

## 2022-02-21 DIAGNOSIS — M21611 Bunion of right foot: Secondary | ICD-10-CM

## 2022-02-21 DIAGNOSIS — R200 Anesthesia of skin: Secondary | ICD-10-CM

## 2022-02-21 DIAGNOSIS — M21619 Bunion of unspecified foot: Secondary | ICD-10-CM

## 2022-02-21 DIAGNOSIS — M21612 Bunion of left foot: Secondary | ICD-10-CM | POA: Diagnosis not present

## 2022-02-21 DIAGNOSIS — R202 Paresthesia of skin: Secondary | ICD-10-CM | POA: Diagnosis not present

## 2022-02-21 MED ORDER — GABAPENTIN 100 MG PO CAPS: 100.0000 mg | ORAL_CAPSULE | Freq: Three times a day (TID) | ORAL | 3 refills | Status: DC

## 2022-02-28 ENCOUNTER — Encounter: Payer: Self-pay | Admitting: Podiatry

## 2022-02-28 NOTE — Progress Notes (Signed)
Subjective:  Patient ID: Brandi Henry, female    DOB: 1955/07/03,  MRN: [id number]  Chief Complaint  Patient presents with   Bunions    Patient is here for bilateral bunions that are painful for 8 months.    66 y.o. female presents with the above complaint.  Patient presents with complaint bilateral numbness tingling left greater than right side.  She states been going for quite some time is progressive gotten worse.  She wanted to get it evaluated.  She also has painful antalgic gait as well.  She would like to discuss orthotics option she has not seen anyone else prior to seeing me.  She has not had nerve conduction study done in the past.  She would like to discuss treatment options   Review of Systems: Negative except as noted in the HPI. Denies N/V/F/Ch.  Past Medical History:  Diagnosis Date   Actinic keratosis    Arthritis    Basal cell carcinoma 05/06/2019   Left mid forearm. BCC with sclerosis. Excised: 06/30/2019, margins free   Congenital absence of one kidney    no left kidney   Dyspnea    Hypertension    Hypothyroidism    Obesity    PONV (postoperative nausea and vomiting)    Sleep apnea    wears CPAP    Current Outpatient Medications:    gabapentin (NEURONTIN) 100 MG capsule, Take 1 capsule (100 mg total) by mouth 3 (three) times daily., Disp: 90 capsule, Rfl: 3   levothyroxine (SYNTHROID) 112 MCG tablet, Take by mouth., Disp: , Rfl:    metroNIDAZOLE (METROCREAM) 0.75 % cream, Apply to face once to twice daily for rosacea., Disp: 45 g, Rfl: 3   mometasone (ELOCON) 0.1 % lotion, Apply to affected areas on body (for itch or irritation) once to twice daily. Avoid face, groin, underarms., Disp: 60 mL, Rfl: 2   Multiple Vitamins-Minerals (BARIATRIC MULTIVITAMINS/IRON PO), Take 1 capsule by mouth daily., Disp: , Rfl:    NON FORMULARY, Pt uses cpap nightly, Disp: , Rfl:    triamcinolone cream (KENALOG) 0.1 %, Apply to aa's QD-BID PRN. Avoid applying to face, groin, and  axilla. Use as directed. Long-term use can cause thinning of the skin., Disp: 80 g, Rfl: 0   Azelastine HCl 137 MCG/SPRAY SOLN, Place 1 spray into both nostrils daily as needed (sinuses)., Disp: , Rfl:    escitalopram (LEXAPRO) 5 MG tablet, Take by mouth., Disp: , Rfl:   Social History   Tobacco Use  Smoking Status Never  Smokeless Tobacco Never    Allergies  Allergen Reactions   Elemental Sulfur Hives    Thirty years ago   Sulfa Antibiotics Hives   Objective:  There were no vitals filed for this visit. There is no height or weight on file to calculate BMI. Constitutional Well developed. Well nourished.  Vascular Dorsalis pedis pulses palpable bilaterally. Posterior tibial pulses palpable bilaterally. Capillary refill normal to all digits.  No cyanosis or clubbing noted. Pedal hair growth normal.  Neurologic Normal speech. Oriented to person, place, and time. Negative tarsal tunnel symptoms noted.  Negative common peroneal nerve symptoms noted.  Likely due from the hip/sciatic nerve compression.  Dermatologic Nails within normal limits Skin within the limits  Orthopedic: Pes planovalgus foot structure noted with calcaneovalgus to many toe signs partially able to recruit the arch with dorsiflexion of the hallux.  Unable to perform single double heel raise   Radiographs: 3 views of skeletally mature adult bilateral foot: There  is decreasing calcaneal clinician angle increasing talar declination and anteriorIn the cyma line.  Midfoot arthritis noted posterior and plantar heel spurring noted.  Bunion deformity noted moderate in nature. Assessment:   1. Bunion   2. Numbness and tingling   3. Pes planovalgus    Plan:  Patient was evaluated and treated and all questions answered.  Numbness tingling bilateral left greater than right -All questions and concerns were discussed with the patient in extensive detail.  Ultimately patient may have compression of the sciatic nerve  however she would benefit from nerve conduction study to help evaluate the large fiber nerves.  I discussed with patient she states understanding -Nerve conduction study was ordered   Pes planovalgus -I explained to patient the etiology of pes planovalgus and relationship with arches and various treatment options were discussed.  Given patient foot structure in the setting of arch I believe patient will benefit from custom-made orthotics to help control the hindfoot motion support the arch of the foot and take the stress away from arch.  Patient agrees with the plan like to proceed with orthotics -Patient was casted for orthotics   No follow-ups on file.

## 2022-03-08 DIAGNOSIS — M25569 Pain in unspecified knee: Secondary | ICD-10-CM | POA: Diagnosis not present

## 2022-03-08 DIAGNOSIS — M25559 Pain in unspecified hip: Secondary | ICD-10-CM | POA: Diagnosis not present

## 2022-03-10 DIAGNOSIS — M25569 Pain in unspecified knee: Secondary | ICD-10-CM | POA: Diagnosis not present

## 2022-03-10 DIAGNOSIS — M25559 Pain in unspecified hip: Secondary | ICD-10-CM | POA: Diagnosis not present

## 2022-03-13 DIAGNOSIS — M25569 Pain in unspecified knee: Secondary | ICD-10-CM | POA: Diagnosis not present

## 2022-03-13 DIAGNOSIS — M25559 Pain in unspecified hip: Secondary | ICD-10-CM | POA: Diagnosis not present

## 2022-03-15 DIAGNOSIS — M25559 Pain in unspecified hip: Secondary | ICD-10-CM | POA: Diagnosis not present

## 2022-03-15 DIAGNOSIS — M25569 Pain in unspecified knee: Secondary | ICD-10-CM | POA: Diagnosis not present

## 2022-03-20 DIAGNOSIS — M25559 Pain in unspecified hip: Secondary | ICD-10-CM | POA: Diagnosis not present

## 2022-03-20 DIAGNOSIS — M25569 Pain in unspecified knee: Secondary | ICD-10-CM | POA: Diagnosis not present

## 2022-03-22 DIAGNOSIS — M25569 Pain in unspecified knee: Secondary | ICD-10-CM | POA: Diagnosis not present

## 2022-03-22 DIAGNOSIS — M25559 Pain in unspecified hip: Secondary | ICD-10-CM | POA: Diagnosis not present

## 2022-03-23 DIAGNOSIS — E039 Hypothyroidism, unspecified: Secondary | ICD-10-CM | POA: Diagnosis not present

## 2022-03-27 DIAGNOSIS — M25569 Pain in unspecified knee: Secondary | ICD-10-CM | POA: Diagnosis not present

## 2022-03-27 DIAGNOSIS — M25559 Pain in unspecified hip: Secondary | ICD-10-CM | POA: Diagnosis not present

## 2022-03-29 DIAGNOSIS — M25559 Pain in unspecified hip: Secondary | ICD-10-CM | POA: Diagnosis not present

## 2022-03-29 DIAGNOSIS — M25569 Pain in unspecified knee: Secondary | ICD-10-CM | POA: Diagnosis not present

## 2022-03-30 DIAGNOSIS — M25551 Pain in right hip: Secondary | ICD-10-CM | POA: Diagnosis not present

## 2022-03-30 DIAGNOSIS — Z6841 Body Mass Index (BMI) 40.0 and over, adult: Secondary | ICD-10-CM | POA: Diagnosis not present

## 2022-03-30 DIAGNOSIS — G4733 Obstructive sleep apnea (adult) (pediatric): Secondary | ICD-10-CM | POA: Diagnosis not present

## 2022-03-30 DIAGNOSIS — M8588 Other specified disorders of bone density and structure, other site: Secondary | ICD-10-CM | POA: Diagnosis not present

## 2022-03-30 DIAGNOSIS — M47816 Spondylosis without myelopathy or radiculopathy, lumbar region: Secondary | ICD-10-CM | POA: Diagnosis not present

## 2022-03-30 DIAGNOSIS — E039 Hypothyroidism, unspecified: Secondary | ICD-10-CM | POA: Diagnosis not present

## 2022-03-30 DIAGNOSIS — M25552 Pain in left hip: Secondary | ICD-10-CM | POA: Diagnosis not present

## 2022-03-30 DIAGNOSIS — M4316 Spondylolisthesis, lumbar region: Secondary | ICD-10-CM | POA: Diagnosis not present

## 2022-03-30 DIAGNOSIS — M545 Low back pain, unspecified: Secondary | ICD-10-CM | POA: Diagnosis not present

## 2022-03-30 DIAGNOSIS — Z136 Encounter for screening for cardiovascular disorders: Secondary | ICD-10-CM | POA: Diagnosis not present

## 2022-04-03 DIAGNOSIS — M25569 Pain in unspecified knee: Secondary | ICD-10-CM | POA: Diagnosis not present

## 2022-04-03 DIAGNOSIS — M25559 Pain in unspecified hip: Secondary | ICD-10-CM | POA: Diagnosis not present

## 2022-04-05 ENCOUNTER — Ambulatory Visit: Payer: Medicare HMO | Admitting: Podiatry

## 2022-04-05 DIAGNOSIS — R202 Paresthesia of skin: Secondary | ICD-10-CM

## 2022-04-05 DIAGNOSIS — R200 Anesthesia of skin: Secondary | ICD-10-CM | POA: Diagnosis not present

## 2022-04-05 DIAGNOSIS — M21619 Bunion of unspecified foot: Secondary | ICD-10-CM

## 2022-04-05 NOTE — Progress Notes (Signed)
Subjective:  Patient ID: Brandi Henry, female    DOB: 03-Sep-1955,  MRN: [id number]  Chief Complaint  Patient presents with   Follow-up    Foot pain located in the left foot near great toe, right foot is better than before in December     67 y.o. female presents with the above complaint.  Patient presents for follow-up of bilateral numbness tingling left greater than right side.  She states she is doing a lot better symptoms have resolved.  She would like to hold off on nerve conduction study.  Denies any other acute complaint she is awaiting orthotics   Review of Systems: Negative except as noted in the HPI. Denies N/V/F/Ch.  Past Medical History:  Diagnosis Date   Actinic keratosis    Arthritis    Basal cell carcinoma 05/06/2019   Left mid forearm. BCC with sclerosis. Excised: 06/30/2019, margins free   Congenital absence of one kidney    no left kidney   Dyspnea    Hypertension    Hypothyroidism    Obesity    PONV (postoperative nausea and vomiting)    Sleep apnea    wears CPAP    Current Outpatient Medications:    gabapentin (NEURONTIN) 100 MG capsule, Take 1 capsule (100 mg total) by mouth 3 (three) times daily., Disp: 90 capsule, Rfl: 3   metroNIDAZOLE (METROCREAM) 0.75 % cream, Apply to face once to twice daily for rosacea., Disp: 45 g, Rfl: 3   mometasone (ELOCON) 0.1 % lotion, Apply to affected areas on body (for itch or irritation) once to twice daily. Avoid face, groin, underarms., Disp: 60 mL, Rfl: 2   Multiple Vitamins-Minerals (BARIATRIC MULTIVITAMINS/IRON PO), Take 1 capsule by mouth daily., Disp: , Rfl:    NON FORMULARY, Pt uses cpap nightly, Disp: , Rfl:    triamcinolone cream (KENALOG) 0.1 %, Apply to aa's QD-BID PRN. Avoid applying to face, groin, and axilla. Use as directed. Long-term use can cause thinning of the skin., Disp: 80 g, Rfl: 0   Azelastine HCl 137 MCG/SPRAY SOLN, Place 1 spray into both nostrils daily as needed (sinuses)., Disp: , Rfl:     escitalopram (LEXAPRO) 5 MG tablet, Take by mouth., Disp: , Rfl:    levothyroxine (SYNTHROID) 112 MCG tablet, Take by mouth., Disp: , Rfl:   Social History   Tobacco Use  Smoking Status Never  Smokeless Tobacco Never    Allergies  Allergen Reactions   Elemental Sulfur Hives    Thirty years ago   Sulfa Antibiotics Hives   Objective:  There were no vitals filed for this visit. There is no height or weight on file to calculate BMI. Constitutional Well developed. Well nourished.  Vascular Dorsalis pedis pulses palpable bilaterally. Posterior tibial pulses palpable bilaterally. Capillary refill normal to all digits.  No cyanosis or clubbing noted. Pedal hair growth normal.  Neurologic Normal speech. Oriented to person, place, and time. Negative tarsal tunnel symptoms noted.  Negative common peroneal nerve symptoms noted.  Likely due from the hip/sciatic nerve compression.  Dermatologic Nails within normal limits Skin within the limits  Orthopedic: Pes planovalgus foot structure noted with calcaneovalgus to many toe signs partially able to recruit the arch with dorsiflexion of the hallux.  Unable to perform single double heel raise   Radiographs: 3 views of skeletally mature adult bilateral foot: There is decreasing calcaneal clinician angle increasing talar declination and anteriorIn the cyma line.  Midfoot arthritis noted posterior and plantar heel spurring noted.  Bunion deformity  noted moderate in nature. Assessment:   No diagnosis found.  Plan:  Patient was evaluated and treated and all questions answered.  Numbness tingling bilateral left greater than right -Clinically resolved.  She can do nerve conduction study upon her discretion.  For now we will hold off it seems to have self resolved.  Pes planovalgus -I explained to patient the etiology of pes planovalgus and relationship with arches and various treatment options were discussed.  Given patient foot structure in the  setting of arch I believe patient will benefit from custom-made orthotics to help control the hindfoot motion support the arch of the foot and take the stress away from arch.  Patient agrees with the plan like to proceed with orthotics -Patient was casted for orthotics   No follow-ups on file.  Clinically resolved.  Can do nerve conduction study upon her discretion.

## 2022-04-07 DIAGNOSIS — M25559 Pain in unspecified hip: Secondary | ICD-10-CM | POA: Diagnosis not present

## 2022-04-07 DIAGNOSIS — M25569 Pain in unspecified knee: Secondary | ICD-10-CM | POA: Diagnosis not present

## 2022-04-10 DIAGNOSIS — M25559 Pain in unspecified hip: Secondary | ICD-10-CM | POA: Diagnosis not present

## 2022-04-10 DIAGNOSIS — M25569 Pain in unspecified knee: Secondary | ICD-10-CM | POA: Diagnosis not present

## 2022-04-12 DIAGNOSIS — M25559 Pain in unspecified hip: Secondary | ICD-10-CM | POA: Diagnosis not present

## 2022-04-12 DIAGNOSIS — M25569 Pain in unspecified knee: Secondary | ICD-10-CM | POA: Diagnosis not present

## 2022-04-12 DIAGNOSIS — M7062 Trochanteric bursitis, left hip: Secondary | ICD-10-CM | POA: Diagnosis not present

## 2022-04-17 DIAGNOSIS — M25569 Pain in unspecified knee: Secondary | ICD-10-CM | POA: Diagnosis not present

## 2022-04-17 DIAGNOSIS — M25559 Pain in unspecified hip: Secondary | ICD-10-CM | POA: Diagnosis not present

## 2022-04-19 DIAGNOSIS — M25559 Pain in unspecified hip: Secondary | ICD-10-CM | POA: Diagnosis not present

## 2022-04-19 DIAGNOSIS — M25569 Pain in unspecified knee: Secondary | ICD-10-CM | POA: Diagnosis not present

## 2022-05-08 DIAGNOSIS — M25569 Pain in unspecified knee: Secondary | ICD-10-CM | POA: Diagnosis not present

## 2022-05-08 DIAGNOSIS — M25559 Pain in unspecified hip: Secondary | ICD-10-CM | POA: Diagnosis not present

## 2022-05-10 DIAGNOSIS — I10 Essential (primary) hypertension: Secondary | ICD-10-CM | POA: Diagnosis not present

## 2022-05-10 DIAGNOSIS — E782 Mixed hyperlipidemia: Secondary | ICD-10-CM | POA: Diagnosis not present

## 2022-05-10 DIAGNOSIS — G4733 Obstructive sleep apnea (adult) (pediatric): Secondary | ICD-10-CM | POA: Diagnosis not present

## 2022-05-10 DIAGNOSIS — E039 Hypothyroidism, unspecified: Secondary | ICD-10-CM | POA: Diagnosis not present

## 2022-05-10 DIAGNOSIS — Z905 Acquired absence of kidney: Secondary | ICD-10-CM | POA: Diagnosis not present

## 2022-05-10 DIAGNOSIS — R7303 Prediabetes: Secondary | ICD-10-CM | POA: Diagnosis not present

## 2022-05-10 DIAGNOSIS — Z9884 Bariatric surgery status: Secondary | ICD-10-CM | POA: Diagnosis not present

## 2022-05-12 DIAGNOSIS — M25569 Pain in unspecified knee: Secondary | ICD-10-CM | POA: Diagnosis not present

## 2022-05-12 DIAGNOSIS — M25559 Pain in unspecified hip: Secondary | ICD-10-CM | POA: Diagnosis not present

## 2022-05-15 DIAGNOSIS — M25569 Pain in unspecified knee: Secondary | ICD-10-CM | POA: Diagnosis not present

## 2022-05-15 DIAGNOSIS — M25559 Pain in unspecified hip: Secondary | ICD-10-CM | POA: Diagnosis not present

## 2022-05-22 ENCOUNTER — Other Ambulatory Visit: Payer: Self-pay | Admitting: Family Medicine

## 2022-05-22 DIAGNOSIS — M25559 Pain in unspecified hip: Secondary | ICD-10-CM | POA: Diagnosis not present

## 2022-05-22 DIAGNOSIS — M25569 Pain in unspecified knee: Secondary | ICD-10-CM | POA: Diagnosis not present

## 2022-05-22 DIAGNOSIS — Z1231 Encounter for screening mammogram for malignant neoplasm of breast: Secondary | ICD-10-CM

## 2022-05-22 DIAGNOSIS — G4733 Obstructive sleep apnea (adult) (pediatric): Secondary | ICD-10-CM | POA: Diagnosis not present

## 2022-05-24 DIAGNOSIS — M25559 Pain in unspecified hip: Secondary | ICD-10-CM | POA: Diagnosis not present

## 2022-05-24 DIAGNOSIS — G4733 Obstructive sleep apnea (adult) (pediatric): Secondary | ICD-10-CM | POA: Diagnosis not present

## 2022-05-24 DIAGNOSIS — M25569 Pain in unspecified knee: Secondary | ICD-10-CM | POA: Diagnosis not present

## 2022-05-29 DIAGNOSIS — E039 Hypothyroidism, unspecified: Secondary | ICD-10-CM | POA: Diagnosis not present

## 2022-05-29 DIAGNOSIS — M25569 Pain in unspecified knee: Secondary | ICD-10-CM | POA: Diagnosis not present

## 2022-05-29 DIAGNOSIS — M25559 Pain in unspecified hip: Secondary | ICD-10-CM | POA: Diagnosis not present

## 2022-05-31 DIAGNOSIS — Z135 Encounter for screening for eye and ear disorders: Secondary | ICD-10-CM | POA: Diagnosis not present

## 2022-05-31 DIAGNOSIS — H35342 Macular cyst, hole, or pseudohole, left eye: Secondary | ICD-10-CM | POA: Diagnosis not present

## 2022-05-31 DIAGNOSIS — H16223 Keratoconjunctivitis sicca, not specified as Sjogren's, bilateral: Secondary | ICD-10-CM | POA: Diagnosis not present

## 2022-05-31 DIAGNOSIS — M25569 Pain in unspecified knee: Secondary | ICD-10-CM | POA: Diagnosis not present

## 2022-05-31 DIAGNOSIS — E119 Type 2 diabetes mellitus without complications: Secondary | ICD-10-CM | POA: Diagnosis not present

## 2022-05-31 DIAGNOSIS — H353132 Nonexudative age-related macular degeneration, bilateral, intermediate dry stage: Secondary | ICD-10-CM | POA: Diagnosis not present

## 2022-05-31 DIAGNOSIS — M25559 Pain in unspecified hip: Secondary | ICD-10-CM | POA: Diagnosis not present

## 2022-05-31 DIAGNOSIS — H524 Presbyopia: Secondary | ICD-10-CM | POA: Diagnosis not present

## 2022-06-12 DIAGNOSIS — M25559 Pain in unspecified hip: Secondary | ICD-10-CM | POA: Diagnosis not present

## 2022-06-12 DIAGNOSIS — M25569 Pain in unspecified knee: Secondary | ICD-10-CM | POA: Diagnosis not present

## 2022-06-14 DIAGNOSIS — E119 Type 2 diabetes mellitus without complications: Secondary | ICD-10-CM | POA: Diagnosis not present

## 2022-06-14 DIAGNOSIS — H353132 Nonexudative age-related macular degeneration, bilateral, intermediate dry stage: Secondary | ICD-10-CM | POA: Diagnosis not present

## 2022-06-14 DIAGNOSIS — H35341 Macular cyst, hole, or pseudohole, right eye: Secondary | ICD-10-CM | POA: Diagnosis not present

## 2022-06-14 DIAGNOSIS — H18523 Epithelial (juvenile) corneal dystrophy, bilateral: Secondary | ICD-10-CM | POA: Diagnosis not present

## 2022-06-16 ENCOUNTER — Ambulatory Visit
Admission: RE | Admit: 2022-06-16 | Discharge: 2022-06-16 | Disposition: A | Discharge status: Home / Self Care | Payer: Medicare HMO | Source: Ambulatory Visit | Attending: Family Medicine | Admitting: Family Medicine

## 2022-06-16 DIAGNOSIS — Z1231 Encounter for screening mammogram for malignant neoplasm of breast: Secondary | ICD-10-CM | POA: Insufficient documentation

## 2022-06-19 DIAGNOSIS — M25559 Pain in unspecified hip: Secondary | ICD-10-CM | POA: Diagnosis not present

## 2022-06-19 DIAGNOSIS — M25569 Pain in unspecified knee: Secondary | ICD-10-CM | POA: Diagnosis not present

## 2022-06-21 DIAGNOSIS — M25559 Pain in unspecified hip: Secondary | ICD-10-CM | POA: Diagnosis not present

## 2022-06-21 DIAGNOSIS — M25569 Pain in unspecified knee: Secondary | ICD-10-CM | POA: Diagnosis not present

## 2022-07-05 DIAGNOSIS — M25559 Pain in unspecified hip: Secondary | ICD-10-CM | POA: Diagnosis not present

## 2022-07-05 DIAGNOSIS — M25569 Pain in unspecified knee: Secondary | ICD-10-CM | POA: Diagnosis not present

## 2022-07-07 DIAGNOSIS — M25559 Pain in unspecified hip: Secondary | ICD-10-CM | POA: Diagnosis not present

## 2022-07-07 DIAGNOSIS — M25569 Pain in unspecified knee: Secondary | ICD-10-CM | POA: Diagnosis not present

## 2022-07-10 DIAGNOSIS — M25569 Pain in unspecified knee: Secondary | ICD-10-CM | POA: Diagnosis not present

## 2022-07-10 DIAGNOSIS — M25559 Pain in unspecified hip: Secondary | ICD-10-CM | POA: Diagnosis not present

## 2022-07-14 DIAGNOSIS — M25559 Pain in unspecified hip: Secondary | ICD-10-CM | POA: Diagnosis not present

## 2022-07-14 DIAGNOSIS — M25569 Pain in unspecified knee: Secondary | ICD-10-CM | POA: Diagnosis not present

## 2022-07-17 DIAGNOSIS — M25569 Pain in unspecified knee: Secondary | ICD-10-CM | POA: Diagnosis not present

## 2022-07-17 DIAGNOSIS — M25559 Pain in unspecified hip: Secondary | ICD-10-CM | POA: Diagnosis not present

## 2022-07-18 ENCOUNTER — Ambulatory Visit: Payer: BC Managed Care – PPO | Admitting: Skilled Nursing Facility1

## 2022-07-19 DIAGNOSIS — H0288B Meibomian gland dysfunction left eye, upper and lower eyelids: Secondary | ICD-10-CM | POA: Diagnosis not present

## 2022-07-19 DIAGNOSIS — H353132 Nonexudative age-related macular degeneration, bilateral, intermediate dry stage: Secondary | ICD-10-CM | POA: Diagnosis not present

## 2022-07-19 DIAGNOSIS — M25559 Pain in unspecified hip: Secondary | ICD-10-CM | POA: Diagnosis not present

## 2022-07-19 DIAGNOSIS — H00015 Hordeolum externum left lower eyelid: Secondary | ICD-10-CM | POA: Diagnosis not present

## 2022-07-19 DIAGNOSIS — H0288A Meibomian gland dysfunction right eye, upper and lower eyelids: Secondary | ICD-10-CM | POA: Diagnosis not present

## 2022-07-19 DIAGNOSIS — M25569 Pain in unspecified knee: Secondary | ICD-10-CM | POA: Diagnosis not present

## 2022-07-19 DIAGNOSIS — L718 Other rosacea: Secondary | ICD-10-CM | POA: Diagnosis not present

## 2022-07-24 ENCOUNTER — Ambulatory Visit: Payer: Medicare HMO | Admitting: Dermatology

## 2022-07-24 ENCOUNTER — Encounter: Payer: Self-pay | Admitting: Dermatology

## 2022-07-24 VITALS — BP 193/90 | HR 60

## 2022-07-24 DIAGNOSIS — L578 Other skin changes due to chronic exposure to nonionizing radiation: Secondary | ICD-10-CM | POA: Diagnosis not present

## 2022-07-24 DIAGNOSIS — Z85828 Personal history of other malignant neoplasm of skin: Secondary | ICD-10-CM

## 2022-07-24 DIAGNOSIS — D2239 Melanocytic nevi of other parts of face: Secondary | ICD-10-CM

## 2022-07-24 DIAGNOSIS — Z1283 Encounter for screening for malignant neoplasm of skin: Secondary | ICD-10-CM

## 2022-07-24 DIAGNOSIS — L719 Rosacea, unspecified: Secondary | ICD-10-CM

## 2022-07-24 DIAGNOSIS — D2272 Melanocytic nevi of left lower limb, including hip: Secondary | ICD-10-CM | POA: Diagnosis not present

## 2022-07-24 DIAGNOSIS — W908XXA Exposure to other nonionizing radiation, initial encounter: Secondary | ICD-10-CM | POA: Diagnosis not present

## 2022-07-24 DIAGNOSIS — L821 Other seborrheic keratosis: Secondary | ICD-10-CM

## 2022-07-24 DIAGNOSIS — L814 Other melanin hyperpigmentation: Secondary | ICD-10-CM | POA: Diagnosis not present

## 2022-07-24 DIAGNOSIS — X32XXXA Exposure to sunlight, initial encounter: Secondary | ICD-10-CM

## 2022-07-24 DIAGNOSIS — Z872 Personal history of diseases of the skin and subcutaneous tissue: Secondary | ICD-10-CM

## 2022-07-24 DIAGNOSIS — Q828 Other specified congenital malformations of skin: Secondary | ICD-10-CM

## 2022-07-24 MED ORDER — METRONIDAZOLE 0.75 % EX CREA
TOPICAL_CREAM | CUTANEOUS | 6 refills | Status: AC
Start: 2022-07-24 — End: ?

## 2022-07-24 NOTE — Progress Notes (Signed)
Follow-Up Visit   Subjective  Brandi Henry is a 67 y.o. female who presents for the following: Skin Cancer Screening and Full Body Skin Exam Hx of bcc, hx of porokeratosis, hx of isks,   The patient presents for Total-Body Skin Exam (TBSE) for skin cancer screening and mole check. The patient has spots, moles and lesions to be evaluated, some may be new or changing and the patient has concerns that these could be cancer.    The following portions of the chart were reviewed this encounter and updated as appropriate: medications, allergies, medical history  Review of Systems:  No other skin or systemic complaints except as noted in HPI or Assessment and Plan.  Objective  Well appearing patient in no apparent distress; mood and affect are within normal limits.  A full examination was performed including scalp, head, eyes, ears, nose, lips, neck, chest, axillae, abdomen, back, buttocks, bilateral upper extremities, bilateral lower extremities, hands, feet, fingers, toes, fingernails, and toenails. All findings within normal limits unless otherwise noted below.   Relevant physical exam findings are noted in the Assessment and Plan.         Assessment & Plan   LENTIGINES, SEBORRHEIC KERATOSES, HEMANGIOMAS - Benign normal skin lesions - Benign-appearing - Call for any changes   Porokeratosis R lower calf Exam:  1.4 cm pink scaly patch with keratotic rim, See photo  Benign-appearing. Stable compared to previous visit. Observation.  Call clinic for new or changing lesions.  Recommend daily use of broad spectrum spf 30+ sunscreen to sun-exposed areas.    Rosacea face Exam: Mild erythema with telangectasia on nose, malar cheeks, and chin  Chronic condition with duration or expected duration over one year. Currently well-controlled.    Rosacea is a chronic progressive skin condition usually affecting the face of adults, causing redness and/or acne bumps. It is treatable  but not curable. It sometimes affects the eyes (ocular rosacea) as well. It may respond to topical and/or systemic medication and can flare with stress, sun exposure, alcohol, exercise and some foods.  Daily application of broad spectrum spf 30+ sunscreen to face is recommended to reduce flares.  Treatment Plan:  Continue metronidazole 0.75% cream Apply to face qd/bid     MELANOCYTIC NEVI - Tan-brown and/or pink-flesh-colored symmetric macules and papules - Benign appearing on exam today - Observation - Call clinic for new or changing moles - Recommend daily use of broad spectrum spf 30+ sunscreen to sun-exposed areas.   Nevus (2) L hip 6.0 x 4.0 mm med speckled brown macule  see photo  R medial cheek 4.86mm firm pink flesh papule  Benign-appearing. Stable compared to previous visit. Observation.  Call clinic for new or changing moles.  Recommend daily use of broad spectrum spf 30+ sunscreen to sun-exposed areas.       ACTINIC DAMAGE - Chronic condition, secondary to cumulative UV/sun exposure - diffuse scaly erythematous macules with underlying dyspigmentation - Recommend daily broad spectrum sunscreen SPF 30+ to sun-exposed areas, reapply every 2 hours as needed.  - Staying in the shade or wearing long sleeves, sun glasses (UVA+UVB protection) and wide brim hats (4-inch brim around the entire circumference of the hat) are also recommended for sun protection.  - Call for new or changing lesions.  HISTORY OF BASAL CELL CARCINOMA OF THE SKIN Left mid forearm 3/21 - No evidence of recurrence today - Recommend regular full body skin exams - Recommend daily broad spectrum sunscreen SPF 30+ to sun-exposed areas, reapply  every 2 hours as needed.  - Call if any new or changing lesions are noted between office visits  SKIN CANCER SCREENING PERFORMED TODAY.    Rosacea  Related Medications metroNIDAZOLE (METROCREAM) 0.75 % cream Apply to face once to twice daily for  rosacea.   Return in about 1 year (around 07/24/2023) for TBSE.  I, Asher Muir, CMA, am acting as scribe for Willeen Niece, MD.   Documentation: I have reviewed the above documentation for accuracy and completeness, and I agree with the above.  Willeen Niece, MD

## 2022-07-24 NOTE — Patient Instructions (Addendum)
     Melanoma ABCDEs  Melanoma is the most dangerous type of skin cancer, and is the leading cause of death from skin disease.  You are more likely to develop melanoma if you: Have light-colored skin, light-colored eyes, or red or blond hair Spend a lot of time in the sun Tan regularly, either outdoors or in a tanning bed Have had blistering sunburns, especially during childhood Have a close family member who has had a melanoma Have atypical moles or large birthmarks  Early detection of melanoma is key since treatment is typically straightforward and cure rates are extremely high if we catch it early.   The first sign of melanoma is often a change in a mole or a new dark spot.  The ABCDE system is a way of remembering the signs of melanoma.  A for asymmetry:  The two halves do not match. B for border:  The edges of the growth are irregular. C for color:  A mixture of colors are present instead of an even brown color. D for diameter:  Melanomas are usually (but not always) greater than 6mm - the size of a pencil eraser. E for evolution:  The spot keeps changing in size, shape, and color.  Please check your skin once per month between visits. You can use a small mirror in front and a large mirror behind you to keep an eye on the back side or your body.   If you see any new or changing lesions before your next follow-up, please call to schedule a visit.  Please continue daily skin protection including broad spectrum sunscreen SPF 30+ to sun-exposed areas, reapplying every 2 hours as needed when you're outdoors.   Staying in the shade or wearing long sleeves, sun glasses (UVA+UVB protection) and wide brim hats (4-inch brim around the entire circumference of the hat) are also recommended for sun protection.    Due to recent changes in healthcare laws, you may see results of your pathology and/or laboratory studies on MyChart before the doctors have had a chance to review them. We  understand that in some cases there may be results that are confusing or concerning to you. Please understand that not all results are received at the same time and often the doctors may need to interpret multiple results in order to provide you with the best plan of care or course of treatment. Therefore, we ask that you please give us 2 business days to thoroughly review all your results before contacting the office for clarification. Should we see a critical lab result, you will be contacted sooner.   If You Need Anything After Your Visit  If you have any questions or concerns for your doctor, please call our main line at [phone] and press option 4 to reach your doctor's medical assistant. If no one answers, please leave a voicemail as directed and we will return your call as soon as possible. Messages left after 4 pm will be answered the following business day.   You may also send us a message via MyChart. We typically respond to MyChart messages within 1-2 business days.  For prescription refills, please ask your pharmacy to contact our office. Our fax number is [phone].  If you have an urgent issue when the clinic is closed that cannot wait until the next business day, you can page your doctor at the number below.    Please note that while we do our best to be available for urgent issues   outside of office hours, we are not available 24/7.   If you have an urgent issue and are unable to reach us, you may choose to seek medical care at your doctor's office, retail clinic, urgent care center, or emergency room.  If you have a medical emergency, please immediately call 911 or go to the emergency department.  Pager Numbers  - Dr. Kowalski: [phone]  - Dr. Moye: [phone]  - Dr. Stewart: [phone]  In the event of inclement weather, please call our main line at [phone] for an update on the status of any delays or closures.  Dermatology Medication Tips: Please  keep the boxes that topical medications come in in order to help keep track of the instructions about where and how to use these. Pharmacies typically print the medication instructions only on the boxes and not directly on the medication tubes.   If your medication is too expensive, please contact our office at [phone] option 4 or send us a message through MyChart.   We are unable to tell what your co-pay for medications will be in advance as this is different depending on your insurance coverage. However, we may be able to find a substitute medication at lower cost or fill out paperwork to get insurance to cover a needed medication.   If a prior authorization is required to get your medication covered by your insurance company, please allow us 1-2 business days to complete this process.  Drug prices often vary depending on where the prescription is filled and some pharmacies may offer cheaper prices.  The website www.goodrx.com contains coupons for medications through different pharmacies. The prices here do not account for what the cost may be with help from insurance (it may be cheaper with your insurance), but the website can give you the price if you did not use any insurance.  - You can print the associated coupon and take it with your prescription to the pharmacy.  - You may also stop by our office during regular business hours and pick up a GoodRx coupon card.  - If you need your prescription sent electronically to a different pharmacy, notify our office through [facility] MyChart or by phone at [phone] option 4.     Si Usted Necesita Algo Despus de Su Visita  Tambin puede enviarnos un mensaje a travs de MyChart. Por lo general respondemos a los mensajes de MyChart en el transcurso de 1 a 2 das hbiles.  Para renovar recetas, por favor pida a su farmacia que se ponga en contacto con nuestra oficina. Nuestro nmero de fax es el [phone].  Si tiene un asunto urgente  cuando la clnica est cerrada y que no puede esperar hasta el siguiente da hbil, puede llamar/localizar a su doctor(a) al nmero que aparece a continuacin.   Por favor, tenga en cuenta que aunque hacemos todo lo posible para estar disponibles para asuntos urgentes fuera del horario de oficina, no estamos disponibles las 24 horas del da, los 7 das de la semana.   Si tiene un problema urgente y no puede comunicarse con nosotros, puede optar por buscar atencin mdica  en el consultorio de su doctor(a), en una clnica privada, en un centro de atencin urgente o en una sala de emergencias.  Si tiene una emergencia mdica, por favor llame inmediatamente al 911 o vaya a la sala de emergencias.  Nmeros de bper  - Dr. Kowalski: [phone]  - Dra. Moye: [phone]  - Dra. Stewart: [phone]  En caso   de inclemencias del tiempo, por favor llame a nuestra lnea principal al [phone] para una actualizacin sobre el estado de cualquier retraso o cierre.  Consejos para la medicacin en dermatologa: Por favor, guarde las cajas en las que vienen los medicamentos de uso tpico para ayudarle a seguir las instrucciones sobre dnde y cmo usarlos. Las farmacias generalmente imprimen las instrucciones del medicamento slo en las cajas y no directamente en los tubos del medicamento.   Si su medicamento es muy caro, por favor, pngase en contacto con nuestra oficina llamando al [phone] y presione la opcin 4 o envenos un mensaje a travs de MyChart.   No podemos decirle cul ser su copago por los medicamentos por adelantado ya que esto es diferente dependiendo de la cobertura de su seguro. Sin embargo, es posible que podamos encontrar un medicamento sustituto a menor costo o llenar un formulario para que el seguro cubra el medicamento que se considera necesario.   Si se requiere una autorizacin previa para que su compaa de seguros cubra su medicamento, por favor permtanos de 1 a 2  das hbiles para completar este proceso.  Los precios de los medicamentos varan con frecuencia dependiendo del lugar de dnde se surte la receta y alguna farmacias pueden ofrecer precios ms baratos.  El sitio web www.goodrx.com tiene cupones para medicamentos de diferentes farmacias. Los precios aqu no tienen en cuenta lo que podra costar con la ayuda del seguro (puede ser ms barato con su seguro), pero el sitio web puede darle el precio si no utiliz ningn seguro.  - Puede imprimir el cupn correspondiente y llevarlo con su receta a la farmacia.  - Tambin puede pasar por nuestra oficina durante el horario de atencin regular y recoger una tarjeta de cupones de GoodRx.  - Si necesita que su receta se enve electrnicamente a una farmacia diferente, informe a nuestra oficina a travs de MyChart de [facility] o por telfono llamando al [phone] y presione la opcin 4.  

## 2022-07-25 ENCOUNTER — Encounter: Payer: Self-pay | Admitting: Skilled Nursing Facility1

## 2022-07-25 ENCOUNTER — Encounter: Payer: Medicare HMO | Attending: General Surgery | Admitting: Skilled Nursing Facility1

## 2022-07-25 VITALS — Ht 62.0 in | Wt 253.1 lb

## 2022-07-25 DIAGNOSIS — Z713 Dietary counseling and surveillance: Secondary | ICD-10-CM | POA: Diagnosis not present

## 2022-07-25 DIAGNOSIS — Z6841 Body Mass Index (BMI) 40.0 and over, adult: Secondary | ICD-10-CM | POA: Insufficient documentation

## 2022-07-25 NOTE — Progress Notes (Signed)
Follow-up visit:  Post-Operative sleeve Surgery   Surgery date: 10/25/2020 Surgery type: sleeve Start weight at [facility]: 324 Weight today: 236 pounds    Body Composition Scale 03/16/2021 06/13/2021 10/11/2021 01/17/2022 07/25/2022  Current Body Weight 251.1 238.6 235.8 236 253.1  Total Body Fat % 48.7 47.8 47.5 47.5 49.1  Visceral Fat 22 20 20 20 22   Fat-Free Mass % 51.2 52.1 52.4 52.4 50.8   Total Body Water % 40.1 40.5 40.7 40.7 39.9  Muscle-Mass lbs 27.7 27.4 27.4 27.4 27.4  BMI 45.8 43.5 43 43 46.2  Body Fat Displacement              Torso  lbs 75.9 70.6 69.5 69.5 77.1         Left Leg  lbs 15.1 14.1 13.9 13.9 15.4         Right Leg  lbs 15.1 14.1 13.9 13.9 15.4         Left Arm  lbs 7.5 7.0 6.9 6.9 7.7         Right Arm   lbs 7.5 7.0 6.9 6.9 7.7   Clinical  Medical hx: prediebetes, congenital one kidney  Medications: off all medications other than thyroid Labs: A1C 5.4 Notable signs/symptoms: dry skin, itchy ears, joint pain, knee pain  Any previous deficiencies? No  Pt states she has a new roommate that is causing a lot of stress which probably resulted in weight gain for her along with other stressors.  Pt states she does have a trainer at [employer].  Pt states something is going on with her feet swelling and pain in her heels.  Pt states she cannot wait longer than 4 hours without eating.  Pt states she is ready to work on her relationship with food.   24 hr recall: First meal: bacon and eggs and grits Snack:  Second meal: sandwich + carrots or celery and handful of chips Snack: fruit smoothie: berry, banana, cherries, almond milk Third meal: salad with protein  Beverages: water, decaf coffee + skim milk + skinny syrup   Fluid intake: 60-100 ounces   Medications: See List Supplementation: multi and calcium   Using straws: no Drinking while eating: no Having you been chewing well: yes Chewing/swallowing difficulties: no Changes in vision: no Changes to  mood/headaches: no Hair loss/Cahnges to skin/Changes to nails: no Any difficulty focusing or concentrating: no Sweating: no Dizziness/Lightheaded: no Palpitations: no  Carbonated beverages: no N/V/D/C/GAS: no Abdominal Pain: no Dumping syndrome: no  Recent physical activity:  Gym 2 times a week working with a trainer 30-60 minutes at a time: PT 2 times a week  Progress Towards Goal(s):  In Progress Teaching method utilized: Visual & Auditory  Demonstrated degree of understanding via: Teach Back  Readiness Level: Action Barriers to learning/adherence to lifestyle change: none identified  Dietitian worked with pt on their emotional relationship with food as this is integral to the pts maintenance of a healthy lifestyle long term.  Identify Triggers: What leads one to emotionally eat? Is there a sudden onset of cravings? Is there rapid consumption?  Awareness  Explore and identify emotional triggers that lead to certain eating behaviors. These triggers could be stress, boredom, sadness, loneliness, or a plethora of other emotions.  Mindful Eating: Encouraged mindfulness during meals. This involves paying attention to the sensory experience of eating, such as the taste, texture, and smell of food. It can help increase awareness of hunger and satisfaction cues. Gave pt mindfulness exercise   Emotional  Awareness: Fostered emotional awareness by helping the patient recognize and express their emotions in ways other than turning to food (moving through the emotion rather than stuffing it down with food). Encouraged journaling, talking to a therapist, or engaging in activities that provide emotional support and assist the pt in more appropriately dealing with their feelings.  Cognitive Behavioral Techniques: Introduce cognitive-behavioral techniques to challenge and change negative thought patterns related to food. Help the patient develop healthier coping mechanisms for dealing with  emotions. Establish Healthy Coping Strategies:  Assist the patient in finding alternative coping strategies for managing emotions without resorting to food. This could include exercise, meditation, deep breathing, or engaging in hobbies.  Support System: Encourage the patient to build a strong support system, whether it's through friends, family, or support groups. Having a network can provide emotional support during challenging times.  Self-Compassion: Foster self-compassion and encourage the patient to be kind to themselves. Building a positive self-image can contribute to healthier relationships with food.  Gradual Changes: Support the patient in making gradual, sustainable changes to their eating habits. Small steps over time are more likely to lead to long-term success.  Professional Counseling: Advised the patient work with a Armed forces technical officer. Professional guidance can provide additional tools and strategies for addressing emotional issues. As a dietitian I cannot offer mental health advice  Nutritional Education: Provided education on the nutritional aspects of food and help the patient understand the role of food in nourishing the body rather than solely as a way to cope with emotions.   Goals: Work on the mindful meals sheet and working on relationship with food  Teaching Method Utilized:  Scientific laboratory technician Hands on  Demonstrated degree of understanding via:  Teach Back   Monitoring/Evaluation:  Dietary intake, exercise, and body weight.   Follow up: 3 weeks

## 2022-07-26 DIAGNOSIS — I10 Essential (primary) hypertension: Secondary | ICD-10-CM | POA: Diagnosis not present

## 2022-07-26 DIAGNOSIS — M25569 Pain in unspecified knee: Secondary | ICD-10-CM | POA: Diagnosis not present

## 2022-07-26 DIAGNOSIS — G4733 Obstructive sleep apnea (adult) (pediatric): Secondary | ICD-10-CM | POA: Diagnosis not present

## 2022-07-26 DIAGNOSIS — M25559 Pain in unspecified hip: Secondary | ICD-10-CM | POA: Diagnosis not present

## 2022-08-02 DIAGNOSIS — M25559 Pain in unspecified hip: Secondary | ICD-10-CM | POA: Diagnosis not present

## 2022-08-02 DIAGNOSIS — M25569 Pain in unspecified knee: Secondary | ICD-10-CM | POA: Diagnosis not present

## 2022-08-04 DIAGNOSIS — M25569 Pain in unspecified knee: Secondary | ICD-10-CM | POA: Diagnosis not present

## 2022-08-04 DIAGNOSIS — M25559 Pain in unspecified hip: Secondary | ICD-10-CM | POA: Diagnosis not present

## 2022-08-07 DIAGNOSIS — M25559 Pain in unspecified hip: Secondary | ICD-10-CM | POA: Diagnosis not present

## 2022-08-07 DIAGNOSIS — M25569 Pain in unspecified knee: Secondary | ICD-10-CM | POA: Diagnosis not present

## 2022-08-09 DIAGNOSIS — M25559 Pain in unspecified hip: Secondary | ICD-10-CM | POA: Diagnosis not present

## 2022-08-09 DIAGNOSIS — M25569 Pain in unspecified knee: Secondary | ICD-10-CM | POA: Diagnosis not present

## 2022-08-16 DIAGNOSIS — M25559 Pain in unspecified hip: Secondary | ICD-10-CM | POA: Diagnosis not present

## 2022-08-16 DIAGNOSIS — M25569 Pain in unspecified knee: Secondary | ICD-10-CM | POA: Diagnosis not present

## 2022-08-23 ENCOUNTER — Encounter: Payer: Self-pay | Admitting: Skilled Nursing Facility1

## 2022-08-23 ENCOUNTER — Encounter: Payer: Medicare HMO | Attending: General Surgery | Admitting: Skilled Nursing Facility1

## 2022-08-23 DIAGNOSIS — Z6841 Body Mass Index (BMI) 40.0 and over, adult: Secondary | ICD-10-CM | POA: Diagnosis not present

## 2022-08-23 DIAGNOSIS — E669 Obesity, unspecified: Secondary | ICD-10-CM | POA: Insufficient documentation

## 2022-08-23 DIAGNOSIS — Z713 Dietary counseling and surveillance: Secondary | ICD-10-CM | POA: Insufficient documentation

## 2022-08-23 NOTE — Progress Notes (Signed)
Follow-up visit:  Post-Operative sleeve Surgery  Surgery date: 10/25/2020 Surgery type: sleeve Start weight at [facility]: 324 Weight today: 236 pounds    Body Composition Scale 03/16/2021 06/13/2021 10/11/2021 01/17/2022 07/25/2022  Current Body Weight 251.1 238.6 235.8 236 253.1  Total Body Fat % 48.7 47.8 47.5 47.5 49.1  Visceral Fat 22 20 20 20 22   Fat-Free Mass % 51.2 52.1 52.4 52.4 50.8   Total Body Water % 40.1 40.5 40.7 40.7 39.9  Muscle-Mass lbs 27.7 27.4 27.4 27.4 27.4  BMI 45.8 43.5 43 43 46.2  Body Fat Displacement              Torso  lbs 75.9 70.6 69.5 69.5 77.1         Left Leg  lbs 15.1 14.1 13.9 13.9 15.4         Right Leg  lbs 15.1 14.1 13.9 13.9 15.4         Left Arm  lbs 7.5 7.0 6.9 6.9 7.7         Right Arm   lbs 7.5 7.0 6.9 6.9 7.7   Clinical  Medical hx: prediebetes, congenital one kidney  Medications: off all medications other than thyroid Labs: A1C 5.4 Notable signs/symptoms: dry skin, itchy ears, joint pain, knee pain  Any previous deficiencies? No  Brandi Henry is doing fantastic! She has been diligent in understanding whether she is hungry or it is appetite and following through with her behaviors.   Pt states her grandchild was just diagnosed with Leukemia.  Pt states she has been checking in with whether she is hungry or it is appetite.  Pt states she has a cruise upcoming in September.   24 hr recall: First meal: fruit smoothie: berry, banana, cherries, almond milk Snack:  Second meal: sandwich + carrots or celery and handful of chips Snack: fruit smoothie: berry, banana, cherries, almond milk Third meal: salad with protein  Beverages: water, decaf coffee + skim milk + skinny syrup   Fluid intake: 60-100 ounces   Medications: See List Supplementation: multi and calcium   Using straws: no Drinking while eating: no Having you been chewing well: yes Chewing/swallowing difficulties: no Changes in vision: no Changes to mood/headaches: no Hair  loss/Cahnges to skin/Changes to nails: no Any difficulty focusing or concentrating: no Sweating: no Dizziness/Lightheaded: no Palpitations: no  Carbonated beverages: no N/V/D/C/GAS: no Abdominal Pain: no Dumping syndrome: no  Recent physical activity: ADL's  Progress Towards Goal(s):  In Progress Teaching method utilized: Visual & Auditory  Demonstrated degree of understanding via: Teach Back  Readiness Level: Action Barriers to learning/adherence to lifestyle change: none identified  Dietitian worked with pt on their emotional relationship with food as this is integral to the pts maintenance of a healthy lifestyle long term.  Identify Triggers: What leads one to emotionally eat? Is there a sudden onset of cravings? Is there rapid consumption?  Awareness  Explore and identify emotional triggers that lead to certain eating behaviors. These triggers could be stress, boredom, sadness, loneliness, or a plethora of other emotions.  Mindful Eating: Encouraged mindfulness during meals. This involves paying attention to the sensory experience of eating, such as the taste, texture, and smell of food. It can help increase awareness of hunger and satisfaction cues. Gave pt mindfulness exercise   Emotional Awareness: Fostered emotional awareness by helping the patient recognize and express their emotions in ways other than turning to food (moving through the emotion rather than stuffing it down with food). Encouraged journaling, talking  to a therapist, or engaging in activities that provide emotional support and assist the pt in more appropriately dealing with their feelings.  Cognitive Behavioral Techniques: Introduce cognitive-behavioral techniques to challenge and change negative thought patterns related to food. Help the patient develop healthier coping mechanisms for dealing with emotions. Establish Healthy Coping Strategies:  Assist the patient in finding alternative coping strategies for  managing emotions without resorting to food. This could include exercise, meditation, deep breathing, or engaging in hobbies.  Support System: Encourage the patient to build a strong support system, whether it's through friends, family, or support groups. Having a network can provide emotional support during challenging times.  Self-Compassion: Foster self-compassion and encourage the patient to be kind to themselves. Building a positive self-image can contribute to healthier relationships with food.  Gradual Changes: Support the patient in making gradual, sustainable changes to their eating habits. Small steps over time are more likely to lead to long-term success.  Professional Counseling: Advised the patient work with a Armed forces technical officer. Professional guidance can provide additional tools and strategies for addressing emotional issues. As a dietitian I cannot offer mental health advice  Nutritional Education: Provided education on the nutritional aspects of food and help the patient understand the role of food in nourishing the body rather than solely as a way to cope with emotions.   Goals: Fantastic work, keep it up!  Teaching Method Utilized:  Visual Auditory Hands on  Demonstrated degree of understanding via:  Teach Back   Monitoring/Evaluation:  Dietary intake, exercise, and body weight.   Follow ZO:[id number]

## 2022-08-24 DIAGNOSIS — M25569 Pain in unspecified knee: Secondary | ICD-10-CM | POA: Diagnosis not present

## 2022-08-24 DIAGNOSIS — M25559 Pain in unspecified hip: Secondary | ICD-10-CM | POA: Diagnosis not present

## 2022-08-30 DIAGNOSIS — M25559 Pain in unspecified hip: Secondary | ICD-10-CM | POA: Diagnosis not present

## 2022-08-30 DIAGNOSIS — M25569 Pain in unspecified knee: Secondary | ICD-10-CM | POA: Diagnosis not present

## 2022-09-04 DIAGNOSIS — M25569 Pain in unspecified knee: Secondary | ICD-10-CM | POA: Diagnosis not present

## 2022-09-04 DIAGNOSIS — M25559 Pain in unspecified hip: Secondary | ICD-10-CM | POA: Diagnosis not present

## 2022-09-11 DIAGNOSIS — M25559 Pain in unspecified hip: Secondary | ICD-10-CM | POA: Diagnosis not present

## 2022-09-11 DIAGNOSIS — M25569 Pain in unspecified knee: Secondary | ICD-10-CM | POA: Diagnosis not present

## 2022-09-20 DIAGNOSIS — M25559 Pain in unspecified hip: Secondary | ICD-10-CM | POA: Diagnosis not present

## 2022-09-20 DIAGNOSIS — M25569 Pain in unspecified knee: Secondary | ICD-10-CM | POA: Diagnosis not present

## 2022-10-09 DIAGNOSIS — M25569 Pain in unspecified knee: Secondary | ICD-10-CM | POA: Diagnosis not present

## 2022-10-09 DIAGNOSIS — M25559 Pain in unspecified hip: Secondary | ICD-10-CM | POA: Diagnosis not present

## 2022-10-12 DIAGNOSIS — H6123 Impacted cerumen, bilateral: Secondary | ICD-10-CM | POA: Diagnosis not present

## 2022-10-12 DIAGNOSIS — J310 Chronic rhinitis: Secondary | ICD-10-CM | POA: Diagnosis not present

## 2022-10-12 DIAGNOSIS — G501 Atypical facial pain: Secondary | ICD-10-CM | POA: Diagnosis not present

## 2022-10-16 ENCOUNTER — Other Ambulatory Visit: Payer: Self-pay | Admitting: Otolaryngology

## 2022-10-16 DIAGNOSIS — J324 Chronic pansinusitis: Secondary | ICD-10-CM

## 2022-10-17 ENCOUNTER — Ambulatory Visit
Admission: RE | Admit: 2022-10-17 | Discharge: 2022-10-17 | Disposition: A | Discharge status: Home / Self Care | Payer: Medicare HMO | Source: Ambulatory Visit | Attending: Otolaryngology | Admitting: Otolaryngology

## 2022-10-17 DIAGNOSIS — J324 Chronic pansinusitis: Secondary | ICD-10-CM

## 2022-10-17 DIAGNOSIS — M25569 Pain in unspecified knee: Secondary | ICD-10-CM | POA: Diagnosis not present

## 2022-10-17 DIAGNOSIS — J3489 Other specified disorders of nose and nasal sinuses: Secondary | ICD-10-CM | POA: Diagnosis not present

## 2022-10-17 DIAGNOSIS — M25559 Pain in unspecified hip: Secondary | ICD-10-CM | POA: Diagnosis not present

## 2023-01-11 ENCOUNTER — Ambulatory Visit: Payer: BC Managed Care – PPO | Admitting: Skilled Nursing Facility1

## 2023-02-26 ENCOUNTER — Ambulatory Visit: Payer: BC Managed Care – PPO | Admitting: Skilled Nursing Facility1

## 2023-03-13 ENCOUNTER — Ambulatory Visit: Payer: BC Managed Care – PPO | Admitting: Skilled Nursing Facility1

## 2023-05-31 ENCOUNTER — Other Ambulatory Visit: Payer: Self-pay | Admitting: Family Medicine

## 2023-05-31 DIAGNOSIS — Z1231 Encounter for screening mammogram for malignant neoplasm of breast: Secondary | ICD-10-CM

## 2023-06-18 ENCOUNTER — Ambulatory Visit
Admission: RE | Admit: 2023-06-18 | Discharge: 2023-06-18 | Disposition: A | Discharge status: Home / Self Care | Source: Ambulatory Visit | Attending: Family Medicine | Admitting: Family Medicine

## 2023-06-18 DIAGNOSIS — Z1231 Encounter for screening mammogram for malignant neoplasm of breast: Secondary | ICD-10-CM | POA: Insufficient documentation

## 2023-07-23 ENCOUNTER — Encounter: Payer: BC Managed Care – PPO | Admitting: Dermatology

## 2023-08-15 ENCOUNTER — Ambulatory Visit: Admitting: Dermatology

## 2023-08-15 DIAGNOSIS — D229 Melanocytic nevi, unspecified: Secondary | ICD-10-CM

## 2023-08-15 DIAGNOSIS — L578 Other skin changes due to chronic exposure to nonionizing radiation: Secondary | ICD-10-CM

## 2023-08-15 DIAGNOSIS — L820 Inflamed seborrheic keratosis: Secondary | ICD-10-CM | POA: Diagnosis not present

## 2023-08-15 DIAGNOSIS — I83892 Varicose veins of left lower extremities with other complications: Secondary | ICD-10-CM

## 2023-08-15 DIAGNOSIS — D2239 Melanocytic nevi of other parts of face: Secondary | ICD-10-CM

## 2023-08-15 DIAGNOSIS — W908XXA Exposure to other nonionizing radiation, initial encounter: Secondary | ICD-10-CM

## 2023-08-15 DIAGNOSIS — L570 Actinic keratosis: Secondary | ICD-10-CM

## 2023-08-15 DIAGNOSIS — L821 Other seborrheic keratosis: Secondary | ICD-10-CM

## 2023-08-15 DIAGNOSIS — D2272 Melanocytic nevi of left lower limb, including hip: Secondary | ICD-10-CM

## 2023-08-15 DIAGNOSIS — Z1283 Encounter for screening for malignant neoplasm of skin: Secondary | ICD-10-CM | POA: Diagnosis not present

## 2023-08-15 DIAGNOSIS — I83813 Varicose veins of bilateral lower extremities with pain: Secondary | ICD-10-CM

## 2023-08-15 DIAGNOSIS — Z85828 Personal history of other malignant neoplasm of skin: Secondary | ICD-10-CM

## 2023-08-15 DIAGNOSIS — I83893 Varicose veins of bilateral lower extremities with other complications: Secondary | ICD-10-CM

## 2023-08-15 DIAGNOSIS — Q828 Other specified congenital malformations of skin: Secondary | ICD-10-CM

## 2023-08-15 DIAGNOSIS — I8391 Asymptomatic varicose veins of right lower extremity: Secondary | ICD-10-CM

## 2023-08-15 DIAGNOSIS — D1801 Hemangioma of skin and subcutaneous tissue: Secondary | ICD-10-CM

## 2023-08-15 DIAGNOSIS — L814 Other melanin hyperpigmentation: Secondary | ICD-10-CM | POA: Diagnosis not present

## 2023-08-15 NOTE — Progress Notes (Signed)
 Follow-Up Visit   Subjective  Brandi Henry is a 68 y.o. female who presents for the following: Skin Cancer Screening and Full Body Skin Exam hx of BCC, Aks, Rosacea  face not using Metronidazole , varicose veins ankles hits with shaving, and bleeds profusely. The patient presents for Total-Body Skin Exam (TBSE) for skin cancer screening and mole check. The patient has spots, moles and lesions to be evaluated, some may be new or changing and the patient may have concern these could be cancer.  Spot on chest gets caught on chain and irritated.    The following portions of the chart were reviewed this encounter and updated as appropriate: medications, allergies, medical history  Review of Systems:  No other skin or systemic complaints except as noted in HPI or Assessment and Plan.  Objective  Well appearing patient in no apparent distress; mood and affect are within normal limits.  A full examination was performed including scalp, head, eyes, ears, nose, lips, neck, chest, axillae, abdomen, back, buttocks, bilateral upper extremities, bilateral lower extremities, hands, feet, fingers, toes, fingernails, and toenails. All findings within normal limits unless otherwise noted below.   Relevant physical exam findings are noted in the Assessment and Plan.  R upper sternum x 1 Stuck on waxy paps with erythema R infraocular x 2 (2) Pink scaly macules R post ankle x 1 keratotic papule  Assessment & Plan   SKIN CANCER SCREENING PERFORMED TODAY.  ACTINIC DAMAGE Chest, neck - Chronic condition, secondary to cumulative UV/sun exposure - diffuse scaly erythematous macules with underlying dyspigmentation - Recommend daily broad spectrum sunscreen SPF 30+ to sun-exposed areas, reapply every 2 hours as needed.  - Staying in the shade or wearing long sleeves, sun glasses (UVA+UVB protection) and wide brim hats (4-inch brim around the entire circumference of the hat) are also recommended for sun  protection.  - Call for new or changing lesions.  LENTIGINES, SEBORRHEIC KERATOSES, HEMANGIOMAS - Benign normal skin lesions - Benign-appearing - Call for any changes  MELANOCYTIC NEVI - Tan-brown and/or pink-flesh-colored symmetric macules and papules - L hip 6.0 x 4.0 mm med speckled brown macule - R medial cheek 4.24mm firm pink flesh papule - Benign appearing on exam today. Stable compared to previous visit. - Observation - Call clinic for new or changing moles - Recommend daily use of broad spectrum spf 30+ sunscreen to sun-exposed areas.     HISTORY OF BASAL CELL CARCINOMA OF THE SKIN - No evidence of recurrence today- L mid forearm - Recommend regular full body skin exams - Recommend daily broad spectrum sunscreen SPF 30+ to sun-exposed areas, reapply every 2 hours as needed.  - Call if any new or changing lesions are noted between office visits    POROKERATOSIS R lower calf Exam: 1.4 cm pink scaly patch with keratotic rim, See photo   Benign-appearing. Stable compared to previous visit. Observation.  Call clinic for new or changing lesions.  Recommend daily use of broad spectrum spf 30+ sunscreen to sun-exposed areas.    Varicose Veins/Spider Veins L lateral leg, hx of bleeding when shaving, painful - Dilated blue, purple or red veins at the lower extremities - Reassured - Smaller vessels can be treated by sclerotherapy (a procedure to inject a medicine into the veins to make them disappear) if desired, but the treatment is not covered by insurance. Larger vessels may be covered if symptomatic and we would refer to vascular surgeon if treatment desired. Referral to [facility]  Xerosis - diffuse xerotic patches - recommend gentle, hydrating skin care - gentle skin care handout given  ROSACEA Exam Mid face mild erythema with telangiectasias  Chronic condition with duration or expected duration over one year. Currently well-controlled.   Rosacea is a  chronic progressive skin condition usually affecting the face of adults, causing redness and/or acne bumps. It is treatable but not curable. It sometimes affects the eyes (ocular rosacea) as well. It may respond to topical and/or systemic medication and can flare with stress, sun exposure, alcohol , exercise, topical steroids (including hydrocortisone/cortisone 10) and some foods.  Daily application of broad spectrum spf 30+ sunscreen to face is recommended to reduce flares.  Patient denies grittiness of the eyes  Treatment Plan Continue metronidazole  0.75% cream Apply to face qd/bid prn flares, Rfs not needed at this time.    SKIN IRRITATION Secondary to CPAP Exam: mild erythema upper nasal dorsum, no scale  Treatment Plan: Benign appearing, recommend keeping a bandaid to protect area or thick coat of Vaseline ointment while using Cpap  Acrochordons (Skin Tags) - Fleshy, skin-colored pedunculated papules upper abdomen - Benign appearing.  - Observe. - If desired, they can be removed with an in office procedure that is not covered by insurance. - Please call the clinic if you notice any new or changing lesions.  SKIN CANCER SCREENING   INFLAMED SEBORRHEIC KERATOSIS R upper sternum x 1 Symptomatic, irritating, patient would like treated. Destruction of lesion - R upper sternum x 1  Destruction method: cryotherapy   Informed consent: discussed and consent obtained   Lesion destroyed using liquid nitrogen: Yes   Region frozen until ice ball extended beyond lesion: Yes   Outcome: patient tolerated procedure well with no complications   Post-procedure details: wound care instructions given   Additional details:  Prior to procedure, discussed risks of blister formation, small wound, skin dyspigmentation, or rare scar following cryotherapy. Recommend Vaseline ointment to treated areas while healing.  AK (ACTINIC KERATOSIS) (2) R infraocular x 2 (2) Actinic keratoses are precancerous  spots that appear secondary to cumulative UV radiation exposure/sun exposure over time. They are chronic with expected duration over 1 year. A portion of actinic keratoses will progress to squamous cell carcinoma of the skin. It is not possible to reliably predict which spots will progress to skin cancer and so treatment is recommended to prevent development of skin cancer.  Recommend daily broad spectrum sunscreen SPF 30+ to sun-exposed areas, reapply every 2 hours as needed.  Recommend staying in the shade or wearing long sleeves, sun glasses (UVA+UVB protection) and wide brim hats (4-inch brim around the entire circumference of the hat). Call for new or changing lesions. Destruction of lesion - R infraocular x 2 (2)  Destruction method: cryotherapy   Informed consent: discussed and consent obtained   Lesion destroyed using liquid nitrogen: Yes   Region frozen until ice ball extended beyond lesion: Yes   Outcome: patient tolerated procedure well with no complications   Post-procedure details: wound care instructions given   Additional details:  Prior to procedure, discussed risks of blister formation, small wound, skin dyspigmentation, or rare scar following cryotherapy. Recommend Vaseline ointment to treated areas while healing.  HYPERTROPHIC ACTINIC KERATOSIS R post ankle x 1 Actinic keratoses are precancerous spots that appear secondary to cumulative UV radiation exposure/sun exposure over time. They are chronic with expected duration over 1 year. A portion of actinic keratoses will progress to squamous cell carcinoma of the skin. It is not possible  to reliably predict which spots will progress to skin cancer and so treatment is recommended to prevent development of skin cancer.  Recommend daily broad spectrum sunscreen SPF 30+ to sun-exposed areas, reapply every 2 hours as needed.  Recommend staying in the shade or wearing long sleeves, sun glasses (UVA+UVB protection) and wide brim hats  (4-inch brim around the entire circumference of the hat). Call for new or changing lesions.  Destruction of lesion - R post ankle x 1  Destruction method: cryotherapy   Informed consent: discussed and consent obtained   Lesion destroyed using liquid nitrogen: Yes   Region frozen until ice ball extended beyond lesion: Yes   Outcome: patient tolerated procedure well with no complications   Post-procedure details: wound care instructions given   Additional details:  Prior to procedure, discussed risks of blister formation, small wound, skin dyspigmentation, or rare scar following cryotherapy. Recommend Vaseline ointment to treated areas while healing.  VARICOSE VEINS OF BOTH LOWER EXTREMITIES WITH PAIN   Related Procedures Ambulatory referral to Vascular Surgery ACTINIC SKIN DAMAGE   LENTIGO   SEBORRHEIC KERATOSIS   HEMANGIOMA OF SKIN   NEVUS   HISTORY OF BASAL CELL CARCINOMA (BCC) OF SKIN   POROKERATOSIS   VARICOSE VEINS OF BILATERAL LOWER EXTREMITIES WITH OTHER COMPLICATIONS    Return in about 1 year (around 08/14/2024) for TBSE, Hx of BCC, Hx of AKs.  I, Rollie Clipper, RMA, am acting as scribe for Artemio Larry, MD .   Documentation: I have reviewed the above documentation for accuracy and completeness, and I agree with the above.  Artemio Larry, MD

## 2023-08-15 NOTE — Patient Instructions (Signed)
 Cryotherapy Aftercare  Wash gently with soap and water everyday.   Apply Vaseline and Band-Aid daily until healed.   Gentle Skin Care Guide  1. Bathe no more than once a day.  2. Avoid bathing in hot water  3. Use a mild soap like Dove, Vanicream, Cetaphil, CeraVe. Can use Lever 2000 or Cetaphil antibacterial soap  4. Use soap only where you need it. On most days, use it under your arms, between your legs, and on your feet. Let the water rinse other areas unless visibly dirty.  5. When you get out of the bath/shower, use a towel to gently blot your skin dry, don't rub it.  6. While your skin is still a little damp, apply a moisturizing cream such as Vanicream, CeraVe, Cetaphil, Eucerin, Sarna lotion or plain Vaseline Jelly. For hands apply Neutrogena [place] Hand Cream or Excipial Hand Cream.  7. Reapply moisturizer any time you start to itch or feel dry.  8. Sometimes using free and clear laundry detergents can be helpful. Fabric softener sheets should be avoided. Downy Free & Gentle liquid, or any liquid fabric softener that is free of dyes and perfumes, it acceptable to use  9. If your doctor has given you prescription creams you may apply moisturizers over them     Seborrheic Keratosis  What causes seborrheic keratoses? Seborrheic keratoses are harmless, common skin growths that first appear during adult life.  As time goes by, more growths appear.  Some people may develop a large number of them.  Seborrheic keratoses appear on both covered and uncovered body parts.  They are not caused by sunlight.  The tendency to develop seborrheic keratoses can be inherited.  They vary in color from skin-colored to gray, brown, or even black.  They can be either smooth or have a rough, warty surface.   Seborrheic keratoses are superficial and look as if they were stuck on the skin.  Under the microscope this type of keratosis looks like layers upon layers of skin.  That is why at times the  top layer may seem to fall off, but the rest of the growth remains and re-grows.    Treatment Seborrheic keratoses do not need to be treated, but can easily be removed in the office.  Seborrheic keratoses often cause symptoms when they rub on clothing or jewelry.  Lesions can be in the way of shaving.  If they become inflamed, they can cause itching, soreness, or burning.  Removal of a seborrheic keratosis can be accomplished by freezing, burning, or surgery. If any spot bleeds, scabs, or grows rapidly, please return to have it checked, as these can be an indication of a skin cancer.   Due to recent changes in healthcare laws, you may see results of your pathology and/or laboratory studies on MyChart before the doctors have had a chance to review them. We understand that in some cases there may be results that are confusing or concerning to you. Please understand that not all results are received at the same time and often the doctors may need to interpret multiple results in order to provide you with the best plan of care or course of treatment. Therefore, we ask that you please give us  2 business days to thoroughly review all your results before contacting the office for clarification. Should we see a critical lab result, you will be contacted sooner.   If You Need Anything After Your Visit  If you have any questions or concerns for your  doctor, please call our main line at [phone] and press option 4 to reach your doctor's medical assistant. If no one answers, please leave a voicemail as directed and we will return your call as soon as possible. Messages left after 4 pm will be answered the following business day.   You may also send us  a message via MyChart. We typically respond to MyChart messages within 1-2 business days.  For prescription refills, please ask your pharmacy to contact our office. Our fax number is [phone].  If you have an urgent issue when the clinic is closed that  cannot wait until the next business day, you can page your doctor at the number below.    Please note that while we do our best to be available for urgent issues outside of office hours, we are not available 24/7.   If you have an urgent issue and are unable to reach us , you may choose to seek medical care at your doctor's office, retail clinic, urgent care center, or emergency room.  If you have a medical emergency, please immediately call 911 or go to the emergency department.  Pager Numbers  - Dr. Bary Likes: [phone]  - Dr. Annette Barters: [phone]  - Dr. Felipe Horton: [phone]   In the event of inclement weather, please call our main line at [phone] for an update on the status of any delays or closures.  Dermatology Medication Tips: Please keep the boxes that topical medications come in in order to help keep track of the instructions about where and how to use these. Pharmacies typically print the medication instructions only on the boxes and not directly on the medication tubes.   If your medication is too expensive, please contact our office at [phone] option 4 or send us  a message through MyChart.   We are unable to tell what your co-pay for medications will be in advance as this is different depending on your insurance coverage. However, we may be able to find a substitute medication at lower cost or fill out paperwork to get insurance to cover a needed medication.   If a prior authorization is required to get your medication covered by your insurance company, please allow us  1-2 business days to complete this process.  Drug prices often vary depending on where the prescription is filled and some pharmacies may offer cheaper prices.  The website www.goodrx.com contains coupons for medications through different pharmacies. The prices here do not account for what the cost may be with help from insurance (it may be cheaper with your insurance), but the website can give you  the price if you did not use any insurance.  - You can print the associated coupon and take it with your prescription to the pharmacy.  - You may also stop by our office during regular business hours and pick up a GoodRx coupon card.  - If you need your prescription sent electronically to a different pharmacy, notify our office through [facility] or by phone at [phone] option 4.     Si Usted Necesita Algo Despus de Su Visita  Tambin puede enviarnos un mensaje a travs de Clinical cytogeneticist. Por lo general respondemos a los mensajes de MyChart en el transcurso de 1 a 2 das hbiles.  Para renovar recetas, por favor pida a su farmacia que se ponga en contacto con nuestra oficina. Franz Jacks de fax es [place] [phone].  Si tiene un asunto urgente cuando la clnica est cerrada y que no puede esperar Freight forwarder  siguiente da hbil, puede llamar/localizar a su doctor(a) al nmero que aparece a continuacin.   Por favor, tenga en cuenta que aunque hacemos todo lo posible para estar disponibles para asuntos urgentes fuera del horario de [place], no estamos disponibles las 24 horas del da, los 7 [address]  [address] de la [place].   Si tiene un problema urgente y no puede comunicarse con nosotros, puede optar por buscar atencin mdica  en el consultorio de su doctor(a), en una clnica privada, en un centro de atencin urgente o en una sala de emergencias.  Si tiene Engineer, drilling, por favor llame inmediatamente al 911 o vaya a la sala de emergencias.  Nmeros de bper  - Dr. Bary Likes: [phone]  - Dra. Annette Barters: [phone]  - Dr. Felipe Horton: [phone]   En caso de inclemencias del tiempo, por favor llame a Lajuan Pila principal al [phone] para una actualizacin sobre el [place] de cualquier retraso o cierre.  Consejos para la medicacin en dermatologa: Por favor, guarde las cajas en las que vienen los medicamentos de uso tpico para ayudarle a seguir las instrucciones sobre dnde y  cmo usarlos. Las farmacias generalmente imprimen las instrucciones del medicamento slo en las cajas y no directamente en los tubos del [place].   Si su medicamento es muy caro, por favor, pngase en contacto con Bettyjane Brunet llamando al [phone] y presione la opcin 4 o envenos un mensaje a travs de Clinical cytogeneticist.   No podemos decirle cul ser su copago por los medicamentos por adelantado ya que esto es diferente dependiendo de la cobertura de su seguro. Sin embargo, es posible que podamos encontrar un medicamento sustituto a Audiological scientist un formulario para que el seguro cubra el medicamento que se considera necesario.   Si se requiere una autorizacin previa para que su compaa de seguros [place] su medicamento, por favor permtanos de 1 a 2 das hbiles para completar este proceso.  Los precios de los medicamentos varan con frecuencia dependiendo del Environmental consultant de dnde se surte la receta y alguna farmacias pueden ofrecer precios ms baratos.  El sitio web www.goodrx.com tiene cupones para medicamentos de Health and safety inspector. Los precios aqu no tienen en cuenta lo que podra costar con la ayuda del seguro (puede ser ms barato con su seguro), pero el sitio web puede darle el precio si no utiliz Tourist information centre manager.  - Puede imprimir el cupn correspondiente y llevarlo con su receta a la farmacia.  - Tambin puede pasar por nuestra oficina durante el horario de atencin regular y Education officer, museum una tarjeta de cupones de GoodRx.  - Si necesita que su receta se enve electrnicamente a una farmacia diferente, informe a nuestra oficina a travs de MyChart de [place] o por telfono llamando al [phone] y presione la opcin 4.

## 2023-10-23 ENCOUNTER — Other Ambulatory Visit (INDEPENDENT_AMBULATORY_CARE_PROVIDER_SITE_OTHER): Payer: Self-pay | Admitting: Vascular Surgery

## 2023-10-23 DIAGNOSIS — I83813 Varicose veins of bilateral lower extremities with pain: Secondary | ICD-10-CM

## 2023-10-26 ENCOUNTER — Encounter (INDEPENDENT_AMBULATORY_CARE_PROVIDER_SITE_OTHER): Admitting: Vascular Surgery

## 2023-10-26 ENCOUNTER — Other Ambulatory Visit (INDEPENDENT_AMBULATORY_CARE_PROVIDER_SITE_OTHER)

## 2023-10-26 DIAGNOSIS — I83813 Varicose veins of bilateral lower extremities with pain: Secondary | ICD-10-CM | POA: Diagnosis not present

## 2023-10-31 ENCOUNTER — Encounter (INDEPENDENT_AMBULATORY_CARE_PROVIDER_SITE_OTHER): Admitting: Vascular Surgery

## 2023-11-02 ENCOUNTER — Ambulatory Visit (INDEPENDENT_AMBULATORY_CARE_PROVIDER_SITE_OTHER): Admitting: Vascular Surgery

## 2023-11-02 ENCOUNTER — Encounter (INDEPENDENT_AMBULATORY_CARE_PROVIDER_SITE_OTHER): Payer: Self-pay | Admitting: Vascular Surgery

## 2023-11-02 VITALS — BP 165/81 | HR 74 | Resp 18 | Wt 277.8 lb

## 2023-11-02 DIAGNOSIS — I890 Lymphedema, not elsewhere classified: Secondary | ICD-10-CM

## 2023-11-02 DIAGNOSIS — I83813 Varicose veins of bilateral lower extremities with pain: Secondary | ICD-10-CM | POA: Diagnosis not present

## 2023-11-02 DIAGNOSIS — Z6841 Body Mass Index (BMI) 40.0 and over, adult: Secondary | ICD-10-CM | POA: Diagnosis not present

## 2023-11-02 NOTE — Progress Notes (Signed)
 Subjective:    Patient ID: Brandi Henry, female    DOB: Jun 07, 1955, 68 y.o.   MRN: [id number] Chief Complaint  Patient presents with   New Patient (Initial Visit)    Ref Jackquline consult ble w/pain     Brandi Henry is a 68 yo female who presents to clinic today with Chief complaint of bilateral lower extremity swelling with painful varicose veins.  Patient endorses her bilateral lower extremity swelling has been very mild for years for the last couple of months she has noticed an increase in the throbbing and aching to her lower extremities with painful varicose veins developing around her ankles.  She endorses she spends a lot of time sitting in an upright position due to the care needed for a grandchild.  This is made her legs extremely dependent over the last 6 months and thus she feels this has been contributing to her difficulties.  She has not exercise much or been able to walk as much she has in the past.  She generally has not been able to elevate her legs as needed other than in bed at night due to being able to adjustable bed.  She endorses she has not tried any conventional therapy prior.  Patient endorses she is going to see her primary care physician next week.  She believes that she may be having some thyroid  difficulties due to fatigue.  She states that she was placed on thyroid  medicine recently.  In her record she is taking Synthroid  at 125 mcg once a day.  She states that she is going to address her weight issue again with her primary care and will ask about the new weight loss drugs on the market and possibly starting those.    Review of Systems  Constitutional: Negative.   Cardiovascular:  Positive for leg swelling.  Musculoskeletal:  Positive for arthralgias and myalgias.  Skin:  Positive for color change.  All other systems reviewed and are negative.      Objective:   Physical Exam Constitutional:      Appearance: Normal appearance. She is obese.  HENT:     Head:  Normocephalic.  Eyes:     Pupils: Pupils are equal, round, and reactive to light.  Cardiovascular:     Rate and Rhythm: Normal rate and regular rhythm.     Pulses: Normal pulses.     Heart sounds: Normal heart sounds.  Pulmonary:     Effort: Pulmonary effort is normal.     Breath sounds: Normal breath sounds.  Abdominal:     General: Bowel sounds are normal.     Palpations: Abdomen is soft.  Musculoskeletal:        General: Swelling and tenderness present.     Right lower leg: Edema present.     Left lower leg: Edema present.  Skin:    General: Skin is warm and dry.     Capillary Refill: Capillary refill takes 2 to 3 seconds.  Neurological:     General: No focal deficit present.     Mental Status: She is alert and oriented to person, place, and time. Mental status is at baseline.  Psychiatric:        Mood and Affect: Mood normal.        Behavior: Behavior normal.        Thought Content: Thought content normal.        Judgment: Judgment normal.     BP (!) 165/81   Pulse 74  Resp 18   Wt 277 lb 12.8 oz (126 kg)   BMI 50.81 kg/m   Past Medical History:  Diagnosis Date   Actinic keratosis    Arthritis    Basal cell carcinoma 05/06/2019   Left mid forearm. BCC with sclerosis. Excised: 06/30/2019, margins free   Congenital absence of one kidney    no left kidney   Dyspnea    Hypertension    Hypothyroidism    Obesity    PONV (postoperative nausea and vomiting)    Sleep apnea    wears CPAP    Social History   Socioeconomic History   Marital status: Divorced    Spouse name: Not on file   Number of children: Not on file   Years of education: Not on file   Highest education level: Not on file  Occupational History   Not on file  Tobacco Use   Smoking status: Never   Smokeless tobacco: Never  Vaping Use   Vaping status: Never Used  Substance and Sexual Activity   Alcohol  use: Yes    Comment: rare   Drug use: No   Sexual activity: Never  Other Topics  Concern   Not on file  Social History Narrative   Not on file   Social Drivers of Health   Financial Resource Strain: Not on file  Food Insecurity: Not on file  Transportation Needs: Not on file  Physical Activity: Insufficiently Active (02/21/2017)   Exercise Vital Sign    Days of Exercise per Week: 7 days    Minutes of Exercise per Session: 20 min  Stress: No Stress Concern Present (02/21/2017)   [employer] of Occupational Health - Occupational Stress Questionnaire    Feeling of Stress : Not at all  Social Connections: Moderately Integrated (02/21/2017)   Social Connection and Isolation Panel    Frequency of Communication with Friends and Family: More than three times a week    Frequency of Social Gatherings with Friends and Family: More than three times a week    Attends Religious Services: More than 4 times per year    Active Member of Clubs or Organizations: Yes    Attends Banker Meetings: More than 4 times per year    Marital Status: Divorced  Intimate Partner Violence: Not At Risk (02/21/2017)   Humiliation, Afraid, Rape, and Kick questionnaire    Fear of Current or Ex-Partner: No    Emotionally Abused: No    Physically Abused: No    Sexually Abused: No    Past Surgical History:  Procedure Laterality Date   ABDOMINAL HYSTERECTOMY     CESAREAN SECTION     COLONOSCOPY W/ BIOPSIES AND POLYPECTOMY     COLONOSCOPY WITH PROPOFOL  N/A 03/15/2020   Procedure: COLONOSCOPY WITH PROPOFOL ;  Surgeon: Maryruth Ole DASEN, MD;  Location: [facility];  Service: Endoscopy;  Laterality: N/A;   thumb surgery  02/2016   ULNAR COLLATERAL LIGAMENT REPAIR Right 02/10/2016   Procedure: Rt thumb Ulnar collateral ligament reconstruction as necessary with pinning metacarpal phalangeal joint ULNAR COLLATERAL LIGAMENT REPAIR;  Surgeon: Elsie Mussel, MD;  Location: [facility];  Service: Orthopedics;  Laterality: Right;   UPPER GI ENDOSCOPY N/A 10/25/2020   Procedure: UPPER GI  ENDOSCOPY;  Surgeon: Tanda Locus, MD;  Location: [facility];  Service: General;  Laterality: N/A;    Family History  Problem Relation Age of Onset   Lung cancer Mother    Diabetes Mother    Hypertension Mother  Colon cancer Father    Diabetes Father    Breast cancer Neg Hx     Allergies  Allergen Reactions   Elemental Sulfur Hives    Thirty years ago   Sulfa Antibiotics Hives       Latest Ref Rng & Units 10/27/2020    4:40 AM 10/26/2020   12:47 PM 10/26/2020    4:06 AM  CBC  WBC 4.0 - 10.5 K/uL 10.8  12.7  11.8   Hemoglobin 12.0 - 15.0 g/dL 88.9  87.6  88.1   Hematocrit 36.0 - 46.0 % 35.8  38.8  37.2   Platelets 150 - 400 K/uL 244  265  246       CMP     Component Value Date/Time   NA 139 10/26/2020 0406   K 4.3 10/26/2020 0406   CL 107 10/26/2020 0406   CO2 26 10/26/2020 0406   GLUCOSE 158 (H) 10/26/2020 0406   BUN 14 10/26/2020 0406   CREATININE 0.61 10/26/2020 0406   CALCIUM 8.9 10/26/2020 0406   PROT 6.7 10/26/2020 0406   ALBUMIN 3.6 10/26/2020 0406   AST 49 (H) 10/26/2020 0406   ALT 71 (H) 10/26/2020 0406   ALKPHOS 55 10/26/2020 0406   BILITOT 0.6 10/26/2020 0406   GFRNONAA >60 10/26/2020 0406     No results found.     Assessment & Plan:   1. Lymphedema (Primary) Recommend:  I have had a long discussion with the patient regarding swelling and why it  causes symptoms.  Patient will begin wearing graduated compression on a daily basis a prescription was given. The patient will  wear the stockings first thing in the morning and removing them in the evening. The patient is instructed specifically not to sleep in the stockings.   In addition, behavioral modification will be initiated.  This will include frequent elevation, use of over the counter pain medications and exercise such as walking.  Consideration for a lymph pump will also be made based upon the effectiveness of conservative therapy.  This would help to improve the edema control and prevent  sequela such as ulcers and infections   Patient completed duplex ultrasound of the venous system to assess for DVT or reflux. Patient does not have any DVT's to bilateral lower extremities. However she does have bilateral lower extremity reflux to her popliteal area. We will continue to follow and monitor.   The patient will follow-up with me after the ultrasound.   2. Varicose veins of both lower extremities with pain SEE Number 1  3. Morbid obesity with BMI of 50.0-59.9, adult [facility]) Patient and I had a 10-minute discussion related to her morbid obesity today and how it relates to bilateral lower extremity lymphedema and varicose veins.  Patient had bariatric surgery with sleeve done years ago.  She currently lost 100 pounds from that which is fantastic.  Recently she has been under a lot of stress and gained some weight back.  I recommend that she speak to her primary care provider about starting [facility] or other weight loss drugs that may help decrease her bilateral lower extremity swelling.   Current Outpatient Medications on File Prior to Visit  Medication Sig Dispense Refill   chlorthalidone (HYGROTON) 25 MG tablet Take 25 mg by mouth daily.     escitalopram  (LEXAPRO ) 10 MG tablet Take 10 mg by mouth daily.     levothyroxine  (SYNTHROID ) 125 MCG tablet Take 125 mcg by mouth daily.     metFORMIN (GLUCOPHAGE)  500 MG tablet Take 500 mg by mouth 2 (two) times daily.     metroNIDAZOLE  (METROCREAM ) 0.75 % cream Apply to face once to twice daily for rosacea. 45 g 6   mometasone  (ELOCON ) 0.1 % lotion Apply to affected areas on body (for itch or irritation) once to twice daily. Avoid face, groin, underarms. 60 mL 2   Multiple Vitamins-Minerals (BARIATRIC MULTIVITAMINS/IRON PO) Take 1 capsule by mouth daily.     NON FORMULARY Pt uses cpap nightly     pravastatin  (PRAVACHOL ) 20 MG tablet Take 20 mg by mouth daily.     triamcinolone  cream (KENALOG ) 0.1 % Apply to aa's QD-BID PRN. Avoid applying to  face, groin, and axilla. Use as directed. Long-term use can cause thinning of the skin. 80 g 0   doxycycline (ADOXA) 50 MG tablet Take by mouth. (Patient not taking: Reported on 11/02/2023)     No current facility-administered medications on file prior to visit.    There are no Patient Instructions on file for this visit. No follow-ups on file.   Brandi JONELLE Shank, NP

## 2024-01-30 ENCOUNTER — Ambulatory Visit (INDEPENDENT_AMBULATORY_CARE_PROVIDER_SITE_OTHER): Admitting: Vascular Surgery

## 2024-02-06 ENCOUNTER — Encounter (INDEPENDENT_AMBULATORY_CARE_PROVIDER_SITE_OTHER): Payer: Self-pay | Admitting: Vascular Surgery

## 2024-02-06 ENCOUNTER — Ambulatory Visit (INDEPENDENT_AMBULATORY_CARE_PROVIDER_SITE_OTHER): Admitting: Vascular Surgery

## 2024-02-06 VITALS — BP 125/74 | HR 68 | Resp 17 | Ht 62.0 in | Wt 273.2 lb

## 2024-02-06 DIAGNOSIS — Z6841 Body Mass Index (BMI) 40.0 and over, adult: Secondary | ICD-10-CM | POA: Diagnosis not present

## 2024-02-06 DIAGNOSIS — I890 Lymphedema, not elsewhere classified: Secondary | ICD-10-CM | POA: Diagnosis not present

## 2024-02-12 ENCOUNTER — Encounter (INDEPENDENT_AMBULATORY_CARE_PROVIDER_SITE_OTHER): Payer: Self-pay | Admitting: Vascular Surgery

## 2024-02-12 NOTE — Progress Notes (Signed)
 Subjective:    Patient ID: Brandi Henry, female    DOB: Feb 14, 1956, 68 y.o.   MRN: [id number] Chief Complaint  Patient presents with   Follow-up    3 months no studies    Brandi Henry is a 68 year old female who presents to clinic today for follow-up of chief complaint of bilateral lower extremity swelling.  Patient endorses nothing with her lower extremities has changed in the last 3 months.  She has not been diligent with conservative therapy such as compression, rest, elevation and exercise.  As she stated last time she had bariatric sleeve surgery which helped her lose 100 pounds but due to difficulties at home she has put some weight back on.  She continues to have difficulty getting compression on due to the size of her legs.  Today she weighs in at 273 pounds and a BMI of 49.97.  She endorses she is doing the best she can.  She plans on following up with her primary care physician as we spoke about last time related to her hypothyroidism, fatigue and weight gain.          History of Present Illness            Results           Review of Systems  Constitutional: Negative.   Cardiovascular:  Positive for leg swelling.  Musculoskeletal:  Positive for myalgias.       Objective:   Physical Exam Vitals reviewed.  Constitutional:      Appearance: Normal appearance. She is obese.     Comments: Patient is morbidly obese at 273 pounds and a BMI today of 49.97  HENT:     Head: Normocephalic and atraumatic.  Eyes:     Pupils: Pupils are equal, round, and reactive to light.  Cardiovascular:     Rate and Rhythm: Normal rate and regular rhythm.     Pulses: Normal pulses.     Heart sounds: Normal heart sounds.  Pulmonary:     Effort: Pulmonary effort is normal.     Breath sounds: Normal breath sounds.  Abdominal:     General: Bowel sounds are normal.     Palpations: Abdomen is soft.  Musculoskeletal:        General: Tenderness present.     Right lower leg: Edema  present.     Left lower leg: Edema present.  Skin:    General: Skin is warm and dry.     Capillary Refill: Capillary refill takes 2 to 3 seconds.  Neurological:     General: No focal deficit present.     Mental Status: She is alert and oriented to person, place, and time. Mental status is at baseline.  Psychiatric:        Mood and Affect: Mood normal.        Behavior: Behavior normal.        Thought Content: Thought content normal.        Judgment: Judgment normal.     Physical Exam          BP 125/74   Pulse 68   Resp 17   Ht 5' 2 (1.575 m)   Wt 273 lb 3.2 oz (123.9 kg)   BMI 49.97 kg/m   Past Medical History:  Diagnosis Date   Actinic keratosis    Arthritis    Basal cell carcinoma 05/06/2019   Left mid forearm. BCC with sclerosis. Excised: 06/30/2019, margins free   Congenital absence of one  kidney    no left kidney   Dyspnea    Hypertension    Hypothyroidism    Obesity    PONV (postoperative nausea and vomiting)    Sleep apnea    wears CPAP    Social History   Socioeconomic History   Marital status: Divorced    Spouse name: Not on file   Number of children: Not on file   Years of education: Not on file   Highest education level: Not on file  Occupational History   Not on file  Tobacco Use   Smoking status: Never   Smokeless tobacco: Never  Vaping Use   Vaping status: Never Used  Substance and Sexual Activity   Alcohol  use: Yes    Comment: rare   Drug use: No   Sexual activity: Never  Other Topics Concern   Not on file  Social History Narrative   Not on file   Social Drivers of Health   Financial Resource Strain: Not on file  Food Insecurity: Not on file  Transportation Needs: Not on file  Physical Activity: Insufficiently Active (02/21/2017)   Exercise Vital Sign    Days of Exercise per Week: 7 days    Minutes of Exercise per Session: 20 min  Stress: No Stress Concern Present (02/21/2017)   [employer] of Occupational Health -  Occupational Stress Questionnaire    Feeling of Stress : Not at all  Social Connections: Moderately Integrated (02/21/2017)   Social Connection and Isolation Panel    Frequency of Communication with Friends and Family: More than three times a week    Frequency of Social Gatherings with Friends and Family: More than three times a week    Attends Religious Services: More than 4 times per year    Active Member of Clubs or Organizations: Yes    Attends Banker Meetings: More than 4 times per year    Marital Status: Divorced  Intimate Partner Violence: Not At Risk (02/21/2017)   Humiliation, Afraid, Rape, and Kick questionnaire    Fear of Current or Ex-Partner: No    Emotionally Abused: No    Physically Abused: No    Sexually Abused: No    Past Surgical History:  Procedure Laterality Date   ABDOMINAL HYSTERECTOMY     CESAREAN SECTION     COLONOSCOPY W/ BIOPSIES AND POLYPECTOMY     COLONOSCOPY WITH PROPOFOL  N/A 03/15/2020   Procedure: COLONOSCOPY WITH PROPOFOL ;  Surgeon: Maryruth Ole DASEN, MD;  Location: [facility];  Service: Endoscopy;  Laterality: N/A;   thumb surgery  02/2016   ULNAR COLLATERAL LIGAMENT REPAIR Right 02/10/2016   Procedure: Rt thumb Ulnar collateral ligament reconstruction as necessary with pinning metacarpal phalangeal joint ULNAR COLLATERAL LIGAMENT REPAIR;  Surgeon: Elsie Mussel, MD;  Location: [facility];  Service: Orthopedics;  Laterality: Right;   UPPER GI ENDOSCOPY N/A 10/25/2020   Procedure: UPPER GI ENDOSCOPY;  Surgeon: Tanda Locus, MD;  Location: [facility];  Service: General;  Laterality: N/A;    Family History  Problem Relation Age of Onset   Lung cancer Mother    Diabetes Mother    Hypertension Mother    Colon cancer Father    Diabetes Father    Breast cancer Neg Hx     Allergies  Allergen Reactions   Elemental Sulfur Hives    Thirty years ago   Sulfa Antibiotics Hives       Latest Ref Rng & Units 10/27/2020    4:40 AM 10/26/2020  12:47 PM 10/26/2020    4:06 AM  CBC  WBC 4.0 - 10.5 K/uL 10.8  12.7  11.8   Hemoglobin 12.0 - 15.0 g/dL 88.9  87.6  88.1   Hematocrit 36.0 - 46.0 % 35.8  38.8  37.2   Platelets 150 - 400 K/uL 244  265  246       CMP     Component Value Date/Time   NA 139 10/26/2020 0406   K 4.3 10/26/2020 0406   CL 107 10/26/2020 0406   CO2 26 10/26/2020 0406   GLUCOSE 158 (H) 10/26/2020 0406   BUN 14 10/26/2020 0406   CREATININE 0.61 10/26/2020 0406   CALCIUM 8.9 10/26/2020 0406   PROT 6.7 10/26/2020 0406   ALBUMIN 3.6 10/26/2020 0406   AST 49 (H) 10/26/2020 0406   ALT 71 (H) 10/26/2020 0406   ALKPHOS 55 10/26/2020 0406   BILITOT 0.6 10/26/2020 0406   GFRNONAA >60 10/26/2020 0406     No results found.     Assessment & Plan:   1. Lymphedema (Primary) Recommend:   I have had a long discussion with the patient regarding swelling and why it  causes symptoms.  Patient will begin wearing graduated compression on a daily basis a prescription was given. The patient will  wear the stockings first thing in the morning and removing them in the evening. The patient is instructed specifically not to sleep in the stockings.    In addition, behavioral modification will be initiated.  This will include frequent elevation, use of over the counter pain medications and exercise such as walking.   Consideration for a lymph pump will also be made based upon the effectiveness of conservative therapy.  This would help to improve the edema control and prevent sequela such as ulcers and infections    Patient completed duplex ultrasound of the venous system to assess for DVT or reflux. Patient does not have any DVT's to bilateral lower extremities. However she does have bilateral lower extremity reflux to her popliteal area. We will continue to follow and monitor.    2. Morbid obesity with BMI of 50.0-59.9, adult [facility]) Patient and I had a 10-minute discussion again today related to her morbid obesity today  and how it relates to bilateral lower extremity lymphedema and varicose veins. Patient had bariatric surgery with sleeve done years ago. She currently lost 100 pounds from that which is fantastic. Recently she has been under a lot of stress and gained some weight back. I recommend that she speak to her primary care provider about starting [facility] or other weight loss drugs that may help decrease her bilateral lower extremity swelling.  I also recommend she follow-up with her PCP regarding her thyroid  condition which she believes is hypothyroidism which is getting worse.  She is having increased fatigue.              Current Outpatient Medications on File Prior to Visit  Medication Sig Dispense Refill   Azelastine  HCl 137 MCG/SPRAY SOLN Place into both nostrils.     chlorthalidone (HYGROTON) 25 MG tablet Take 25 mg by mouth daily.     escitalopram  (LEXAPRO ) 10 MG tablet Take 40 mg by mouth daily.     levothyroxine  (SYNTHROID ) 137 MCG tablet Take 137 mcg by mouth daily.     metFORMIN (GLUCOPHAGE) 500 MG tablet Take 500 mg by mouth 2 (two) times daily.     metroNIDAZOLE  (METROCREAM ) 0.75 % cream Apply to face once to twice daily  for rosacea. 45 g 6   mometasone  (ELOCON ) 0.1 % lotion Apply to affected areas on body (for itch or irritation) once to twice daily. Avoid face, groin, underarms. 60 mL 2   Multiple Vitamins-Minerals (BARIATRIC MULTIVITAMINS/IRON PO) Take 1 capsule by mouth daily.     NON FORMULARY Pt uses cpap nightly     triamcinolone  cream (KENALOG ) 0.1 % Apply to aa's QD-BID PRN. Avoid applying to face, groin, and axilla. Use as directed. Long-term use can cause thinning of the skin. 80 g 0   VEVYE 0.1 % SOLN      doxycycline (ADOXA) 50 MG tablet Take by mouth. (Patient not taking: Reported on 02/06/2024)     levothyroxine  (SYNTHROID ) 125 MCG tablet Take 125 mcg by mouth daily.     pravastatin  (PRAVACHOL ) 20 MG tablet Take 20 mg by mouth daily. (Patient not taking: Reported on  02/06/2024)     No current facility-administered medications on file prior to visit.    There are no Patient Instructions on file for this visit. No follow-ups on file.   Gwendlyn JONELLE Shank, NP

## 2024-08-06 ENCOUNTER — Ambulatory Visit (INDEPENDENT_AMBULATORY_CARE_PROVIDER_SITE_OTHER): Admitting: Vascular Surgery

## 2024-09-02 ENCOUNTER — Encounter: Admitting: Dermatology

## 2024-09-09 ENCOUNTER — Encounter: Admitting: Dermatology
